# Patient Record
Sex: Female | Born: 1945 | Race: White | Hispanic: No | Marital: Married | Smoking: Never smoker
Health system: Southern US, Community
[De-identification: ages and names within clinical notes are randomized; demographics above are authoritative.]

## PROBLEM LIST (undated history)

## (undated) DIAGNOSIS — I10 Essential (primary) hypertension: Secondary | ICD-10-CM

## (undated) DIAGNOSIS — M1990 Unspecified osteoarthritis, unspecified site: Secondary | ICD-10-CM

## (undated) DIAGNOSIS — E119 Type 2 diabetes mellitus without complications: Secondary | ICD-10-CM

## (undated) DIAGNOSIS — G629 Polyneuropathy, unspecified: Secondary | ICD-10-CM

---

## 2014-03-07 ENCOUNTER — Emergency Department (HOSPITAL_COMMUNITY)
Admission: EM | Admit: 2014-03-07 | Discharge: 2014-03-07 | Disposition: A | Discharge status: Home / Self Care | Payer: Worker's Compensation | Attending: Emergency Medicine | Admitting: Emergency Medicine

## 2014-03-07 ENCOUNTER — Emergency Department (HOSPITAL_COMMUNITY): Payer: Worker's Compensation

## 2014-03-07 ENCOUNTER — Encounter (HOSPITAL_COMMUNITY): Payer: Self-pay | Admitting: Emergency Medicine

## 2014-03-07 DIAGNOSIS — S300XXA Contusion of lower back and pelvis, initial encounter: Secondary | ICD-10-CM

## 2014-03-07 DIAGNOSIS — Y929 Unspecified place or not applicable: Secondary | ICD-10-CM | POA: Insufficient documentation

## 2014-03-07 DIAGNOSIS — M129 Arthropathy, unspecified: Secondary | ICD-10-CM | POA: Insufficient documentation

## 2014-03-07 DIAGNOSIS — E119 Type 2 diabetes mellitus without complications: Secondary | ICD-10-CM | POA: Diagnosis not present

## 2014-03-07 DIAGNOSIS — Z79899 Other long term (current) drug therapy: Secondary | ICD-10-CM | POA: Diagnosis not present

## 2014-03-07 DIAGNOSIS — Z791 Long term (current) use of non-steroidal anti-inflammatories (NSAID): Secondary | ICD-10-CM | POA: Insufficient documentation

## 2014-03-07 DIAGNOSIS — W010XXA Fall on same level from slipping, tripping and stumbling without subsequent striking against object, initial encounter: Secondary | ICD-10-CM | POA: Diagnosis not present

## 2014-03-07 DIAGNOSIS — Y9389 Activity, other specified: Secondary | ICD-10-CM | POA: Insufficient documentation

## 2014-03-07 DIAGNOSIS — IMO0002 Reserved for concepts with insufficient information to code with codable children: Secondary | ICD-10-CM | POA: Diagnosis present

## 2014-03-07 DIAGNOSIS — Z8669 Personal history of other diseases of the nervous system and sense organs: Secondary | ICD-10-CM | POA: Insufficient documentation

## 2014-03-07 DIAGNOSIS — I10 Essential (primary) hypertension: Secondary | ICD-10-CM | POA: Insufficient documentation

## 2014-03-07 DIAGNOSIS — G8929 Other chronic pain: Secondary | ICD-10-CM | POA: Diagnosis not present

## 2014-03-07 DIAGNOSIS — M549 Dorsalgia, unspecified: Secondary | ICD-10-CM

## 2014-03-07 HISTORY — DX: Unspecified osteoarthritis, unspecified site: M19.90

## 2014-03-07 HISTORY — DX: Polyneuropathy, unspecified: G62.9

## 2014-03-07 HISTORY — DX: Essential (primary) hypertension: I10

## 2014-03-07 HISTORY — DX: Type 2 diabetes mellitus without complications: E11.9

## 2014-03-07 MED ORDER — OXYCODONE-ACETAMINOPHEN 5-325 MG PO TABS
1.0000 | ORAL_TABLET | Freq: Four times a day (QID) | ORAL | Status: DC | PRN
Start: 2014-03-07 — End: 2015-12-09

## 2014-03-07 NOTE — ED Provider Notes (Signed)
CSN: [id number]     Arrival date & time 03/07/14  1422 History   First MD Initiated Contact with Patient 03/07/14 1503     Chief Complaint  Patient presents with  . Fall     (Consider location/radiation/quality/duration/timing/severity/associated sxs/prior Treatment) Patient is a 68 y.o. female presenting with fall. The history is provided by the patient.  Fall This is a new problem. Episode onset: 2 days ago. The problem occurs constantly. The problem has been unchanged. Associated symptoms include arthralgias. Pertinent negatives include no abdominal pain, fever, headaches, joint swelling, nausea, neck pain, numbness, rash, urinary symptoms, vomiting or weakness. Exacerbated by: sitting. She has tried oral narcotics, position changes and NSAIDs for the symptoms. The treatment provided no relief.    Patient c/o pain to her lower back and tailbone after a fall that occurred 2 days ago.  She states that she tripped and landed on her buttocks.  She has taken vicodin and a muscle relaxer that she has been taking for chronic pain that has not helped since the fall.  Pain is worse with sitting and improves slightly with standing.  She denies abd pain, incontinence of bladder or bowel, dysuria, numbness or weakness of the LE's.  She also denies head injury or LOC.    Past Medical History  Diagnosis Date  . Diabetes mellitus without complication   . Arthritis   . Hypertension   . Neuropathy    History reviewed. No pertinent past surgical history. History reviewed. No pertinent family history. History  Substance Use Topics  . Smoking status: Never Smoker   . Smokeless tobacco: Not on file  . Alcohol Use: No   OB History   Grav Para Term Preterm Abortions TAB SAB Ect Mult Living                 Review of Systems  Constitutional: Negative for fever.  Respiratory: Negative for shortness of breath.   Gastrointestinal: Negative for nausea, vomiting, abdominal pain and constipation.   Genitourinary: Negative for dysuria, hematuria, flank pain, decreased urine volume and difficulty urinating.       No perineal numbness or incontinence of urine or feces  Musculoskeletal: Positive for arthralgias and back pain. Negative for joint swelling and neck pain.  Skin: Negative for rash.  Neurological: Negative for dizziness, syncope, weakness, numbness and headaches.  All other systems reviewed and are negative.     Allergies  Review of patient's allergies indicates no known allergies.  Home Medications   Prior to Admission medications   Medication Sig Start Date End Date Taking? Authorizing Provider  amLODipine (NORVASC) 5 MG tablet Take 5 mg by mouth daily. 03/07/14  Yes Historical Provider, MD  atenolol (TENORMIN) 100 MG tablet Take 100 mg by mouth daily. 02/20/14  Yes Historical Provider, MD  baclofen (LIORESAL) 20 MG tablet Take 20 mg by mouth daily. 01/22/14  Yes Historical Provider, MD  diclofenac (VOLTAREN) 75 MG EC tablet Take 1 tablet by mouth 2 (two) times daily. 02/20/14  Yes Historical Provider, MD  glimepiride (AMARYL) 4 MG tablet Take 4 mg by mouth 2 (two) times daily. 02/20/14  Yes Historical Provider, MD  HYDROcodone-acetaminophen (NORCO/VICODIN) 5-325 MG per tablet Take 1 tablet by mouth at bedtime. For pain 03/07/14  Yes Historical Provider, MD  losartan (COZAAR) 100 MG tablet Take 100 mg by mouth daily. 02/20/14  Yes Historical Provider, MD  metFORMIN (GLUCOPHAGE) 500 MG tablet Take 1,000 mg by mouth 2 (two) times daily. 02/20/14  Yes Historical  Provider, MD  oxyCODONE-acetaminophen (PERCOCET/ROXICET) 5-325 MG per tablet Take 1 tablet by mouth every 6 (six) hours as needed for severe pain. 03/07/14   Braxen Dobek L. Navraj Dreibelbis, PA-C   BP 165/61  Pulse 62  Temp(Src) 98.3 F (36.8 C) (Oral)  Resp 16  Ht 5\' 4"  (1.626 m)  Wt 176 lb (79.833 kg)  BMI 30.20 kg/m2  SpO2 100% Physical Exam  Nursing note and vitals reviewed. Constitutional: She is oriented to person, place, and  time. She appears well-developed and well-nourished. No distress.  HENT:  Head: Normocephalic and atraumatic.  Neck: Normal range of motion. Neck supple.  Cardiovascular: Normal rate, regular rhythm, normal heart sounds and intact distal pulses.   No murmur heard. Pulmonary/Chest: Effort normal and breath sounds normal. No respiratory distress.  Abdominal: Soft. She exhibits no distension. There is no tenderness. There is no rebound and no guarding.  Musculoskeletal: She exhibits tenderness. She exhibits no edema.       Lumbar back: She exhibits tenderness and pain. She exhibits normal range of motion, no swelling, no deformity, no laceration and normal pulse.  ttp of the coccyx and lower lumbar spine. DP pulses are brisk and symmetrical.  Distal sensation intact.  Hip Flexors/Extensors are intact.  Pt has normal strength against resistance of bilateral lower extremities.     Neurological: She is alert and oriented to person, place, and time. She has normal strength. No sensory deficit. She exhibits normal muscle tone. Coordination and gait normal.  Reflex Scores:      Patellar reflexes are 2+ on the right side and 2+ on the left side.      Achilles reflexes are 2+ on the right side and 2+ on the left side. Skin: Skin is warm and dry. No rash noted.    ED Course  Procedures (including critical care time) Labs Review Labs Reviewed - No data to display  Imaging Review Dg Lumbar Spine Complete  03/07/2014   CLINICAL DATA:  68 year old female with low back pain following fall.  EXAM: LUMBAR SPINE - COMPLETE 4+ VIEW  COMPARISON:  None.  FINDINGS: Five non rib-bearing lumbar type vertebra are identified with mild apex right scoliosis.  There is no evidence of acute fracture.  4 mm retrolisthesis of L3 on L4 noted.  Moderate degenerative disc disease and spondylosis from L2-L5 identified. Facet arthropathy from L4-S1 noted.  No focal bony lesions or spondylolysis noted.  IMPRESSION: No evidence of  acute fracture.  Moderate degenerative changes within the lumbar spine as described with grade 1 spondylolisthesis at L3-L4.   Electronically Signed   By: Laveda AbbeJeff  Hu M.D.   On: 03/07/2014 16:31     EKG Interpretation None      MDM   Final diagnoses:  Back pain  Coccygeal contusion    Pt is ambulatory, no focal neuro deficits on exam.  She is well appearing.  No concerning sx's for emergent neurological or infectious process.  Low back pain is acute on chronic and likely exacerbated by her recent fall.  She agrees to symptomatic treatment with percocet and to continue the diclofenac and baclofen and d/c the hydrocodone.  She appears stable for d/c.    Tawsha Terrero L. Trisha Mangleriplett, PA-C 03/07/14 1720

## 2014-03-07 NOTE — ED Notes (Signed)
Larey Seat 6/2 , tripped over a pts walker.  Pain coccyx and buttocks.

## 2014-03-07 NOTE — Discharge Instructions (Signed)
Back Pain, Adult Back pain is very common. The pain often gets better over time. The cause of back pain is usually not dangerous. Most people can learn to manage their back pain on their own.  HOME CARE   Stay active. Start with short walks on flat ground if you can. Try to walk farther each day.  Do not sit, drive, or stand in one place for more than 30 minutes. Do not stay in bed.  Do not avoid exercise or work. Activity can help your back heal faster.  Be careful when you bend or lift an object. Bend at your knees, keep the object close to you, and do not twist.  Sleep on a firm mattress. Lie on your side, and bend your knees. If you lie on your back, put a pillow under your knees.  Only take medicines as told by your doctor.  Put ice on the injured area.  Put ice in a plastic bag.  Place a towel between your skin and the bag.  Leave the ice on for 15-20 minutes, 03-04 times a day for the first 2 to 3 days. After that, you can switch between ice and heat packs.  Ask your doctor about back exercises or massage.  Avoid feeling anxious or stressed. Find good ways to deal with stress, such as exercise. GET HELP RIGHT AWAY IF:   Your pain does not go away with rest or medicine.  Your pain does not go away in 1 week.  You have new problems.  You do not feel well.  The pain spreads into your legs.  You cannot control when you poop (bowel movement) or pee (urinate).  Your arms or legs feel weak or lose feeling (numbness).  You feel sick to your stomach (nauseous) or throw up (vomit).  You have belly (abdominal) pain.  You feel like you may pass out (faint). MAKE SURE YOU:   Understand these instructions.  Will watch your condition.  Will get help right away if you are not doing well or get worse. Document Released: 03/08/2008 Document Revised: 12/13/2011 Document Reviewed: 02/08/2011 [facility] Patient Information 2014 [place], [place].  Tailbone Injury The tailbone  (coccyx) is the small bone at the lower end of the spine. A tailbone injury may involve stretched ligaments, bruising, or a broken bone (fracture). Women are more vulnerable to this injury due to having a wider pelvis. CAUSES  This type of injury typically occurs from falling and landing on the tailbone. Repeated strain or friction from actions such as rowing and bicycling may also injure the area. The tailbone can be injured during childbirth. Infections or tumors may also press on the tailbone and cause pain. Sometimes, the cause of injury is unknown. SYMPTOMS   Bruising.  Pain when sitting.  Painful bowel movements.  In women, pain during intercourse. DIAGNOSIS  Your caregiver can diagnose a tailbone injury based on your symptoms and a physical exam. X-rays may be taken if a fracture is suspected. Your caregiver may also use an MRI scan imaging test to evaluate your symptoms. TREATMENT  Your caregiver may prescribe medicines to help relieve your pain. Most tailbone injuries heal on their own in 4 to 6 weeks. However, if the injury is caused by an infection or tumor, the recovery period may vary. PREVENTION  Wear appropriate padding and sports gear when bicycling and rowing. This can help prevent an injury from repeated strain or friction. HOME CARE INSTRUCTIONS   Put ice on the injured  area.  Put ice in a plastic bag.  Place a towel between your skin and the bag.  Leave the ice on for 15-20 minutes, every hour while awake for the first 1 to 2 days.  Sit on a large, rubber or inflated ring or cushion to ease your pain. Lean forward when sitting to help decrease discomfort.  Avoid sitting for long periods of time.  Increase your activity as the pain allows.  Only take over-the-counter or prescription medicines for pain, discomfort, or fever as directed by your caregiver.  You may use stool softeners if it is painful to have a bowel movement, or as directed by your  caregiver.  Eat a diet with plenty of fiber to help prevent constipation.  Keep all follow-up appointments as directed by your caregiver. SEEK MEDICAL CARE IF:   Your pain becomes worse.  Your bowel movements cause a great deal of discomfort.  You are unable to have a bowel movement.  You have a fever. MAKE SURE YOU:  Understand these instructions.  Will watch your condition.  Will get help right away if you are not doing well or get worse. Document Released: 09/17/2000 Document Revised: 12/13/2011 Document Reviewed: 04/15/2011 [facility] Patient Information 2014 [place], [place].

## 2014-03-08 NOTE — ED Provider Notes (Signed)
Medical screening examination/treatment/procedure(s) were performed by non-physician practitioner and as supervising physician I was immediately available for consultation/collaboration.   EKG Interpretation None        Amina Menchaca L Anaiyah Anglemyer, MD 03/08/14 1532 

## 2015-12-02 DIAGNOSIS — Z0000 Encounter for general adult medical examination without abnormal findings: Secondary | ICD-10-CM | POA: Diagnosis not present

## 2015-12-02 DIAGNOSIS — I10 Essential (primary) hypertension: Secondary | ICD-10-CM | POA: Diagnosis not present

## 2015-12-02 DIAGNOSIS — G4701 Insomnia due to medical condition: Secondary | ICD-10-CM | POA: Diagnosis not present

## 2015-12-02 DIAGNOSIS — E7800 Pure hypercholesterolemia, unspecified: Secondary | ICD-10-CM | POA: Diagnosis not present

## 2015-12-02 DIAGNOSIS — E1149 Type 2 diabetes mellitus with other diabetic neurological complication: Secondary | ICD-10-CM | POA: Diagnosis not present

## 2015-12-02 DIAGNOSIS — D649 Anemia, unspecified: Secondary | ICD-10-CM | POA: Diagnosis not present

## 2015-12-02 DIAGNOSIS — Z23 Encounter for immunization: Secondary | ICD-10-CM | POA: Diagnosis not present

## 2015-12-02 DIAGNOSIS — Z6828 Body mass index (BMI) 28.0-28.9, adult: Secondary | ICD-10-CM | POA: Diagnosis not present

## 2015-12-02 DIAGNOSIS — E118 Type 2 diabetes mellitus with unspecified complications: Secondary | ICD-10-CM | POA: Diagnosis not present

## 2015-12-09 ENCOUNTER — Ambulatory Visit (INDEPENDENT_AMBULATORY_CARE_PROVIDER_SITE_OTHER): Payer: Worker's Compensation | Admitting: Orthopaedic Surgery

## 2015-12-09 ENCOUNTER — Encounter: Payer: Self-pay | Admitting: Orthopaedic Surgery

## 2015-12-09 ENCOUNTER — Ambulatory Visit (INDEPENDENT_AMBULATORY_CARE_PROVIDER_SITE_OTHER): Payer: Worker's Compensation

## 2015-12-09 VITALS — BP 161/81 | HR 68 | Temp 98.1°F | Resp 16 | Ht 64.0 in | Wt 170.0 lb

## 2015-12-09 DIAGNOSIS — M545 Low back pain, unspecified: Secondary | ICD-10-CM

## 2015-12-09 MED ORDER — HYDROCODONE-ACETAMINOPHEN 7.5-325 MG PO TABS: 1.0000 | ORAL_TABLET | ORAL | Status: DC | PRN

## 2015-12-09 NOTE — Progress Notes (Addendum)
Patient ZO:XWRU:Valerie Huff, female DOB:07/05/1946, 70 y.o. [id number]  Chief Complaint  Patient presents with  . Follow-up    follow up tailbone pain "same"    HPI  Valerie MeekerLula Boulay is a 70 y.o. female who has a history of coccyx pain.  She has developed lower back pain over the last several weeks. She has no sciatica.  She has no trauma or bowel or bladder problems.  Back Pain This is a new problem. The current episode started 1 to 4 weeks ago. The problem occurs daily. The problem has been gradually worsening since onset. The pain is present in the lumbar spine. The quality of the pain is described as aching. The pain does not radiate. The pain is at a severity of 4/10. The pain is mild. The symptoms are aggravated by bending, stress and twisting. Associated symptoms include numbness. Pertinent negatives include no chest pain. She has tried heat, home exercises, ice and NSAIDs for the symptoms. The treatment provided mild relief.    Body mass index is 29.17 kg/(m^2).   Review of Systems  Constitutional:       Patient has Diabetes Mellitus. Patient has hypertension. Patient does not have COPD or shortness of breath. Patient does not have BMI > 35. Patient does not have current smoking history.  HENT: Negative for congestion.   Respiratory: Negative for cough and shortness of breath.   Cardiovascular: Negative for chest pain.  Endocrine: Positive for cold intolerance.  Musculoskeletal: Positive for back pain, joint swelling and arthralgias.  Allergic/Immunologic: Positive for environmental allergies.  Neurological: Positive for numbness.    Past Medical History  Diagnosis Date  . Diabetes mellitus without complication (HCC)   . Arthritis   . Hypertension   . Neuropathy (HCC)     No past surgical history on file.  No family history on file.  Social History Social History  Substance Use Topics  . Smoking status: Never Smoker   . Smokeless tobacco: None  . Alcohol Use:  No    No Known Allergies  Current Outpatient Prescriptions  Medication Sig Dispense Refill  . atenolol (TENORMIN) 100 MG tablet Take 100 mg by mouth daily.    . diclofenac (VOLTAREN) 75 MG EC tablet Take 1 tablet by mouth 2 (two) times daily.    Marland Kitchen. glimepiride (AMARYL) 4 MG tablet Take 4 mg by mouth 2 (two) times daily.    Marland Kitchen. losartan (COZAAR) 100 MG tablet Take 100 mg by mouth daily.    . metFORMIN (GLUCOPHAGE) 500 MG tablet Take 1,000 mg by mouth 2 (two) times daily.    Marland Kitchen. tiZANidine (ZANAFLEX) 4 MG capsule Take 4 mg by mouth.    Marland Kitchen. HYDROcodone-acetaminophen (NORCO) 7.5-325 MG tablet Take 1 tablet by mouth every 4 (four) hours as needed for moderate pain (Must last 30 days.  Do not drive or operate machinery while taking this medicine.). 120 tablet 0   No current facility-administered medications for this visit.     Physical Exam  Blood pressure 161/81, pulse 68, temperature 98.1 F (36.7 C), resp. rate 16, height 5\' 4"  (1.626 m), weight 170 lb (77.111 kg).  Constitutional: overall normal hygiene, normal nutrition, well developed, normal grooming, normal body habitus. Assistive device:none  Musculoskeletal: gait and station Limp none, muscle tone and strength are normal, no tremors or atrophy is present.  .  Neurological: coordination overall normal.  Deep tendon reflex/nerve stretch intact.  Sensation normal.  Cranial nerves II-XII intact.   Skin:   normal overall no scars,  lesions, ulcers or rashes. No psoriasis.  Psychiatric: Alert and oriented x 3.  Recent memory intact, remote memory unclear.  Normal mood and affect. Well groomed.  Good eye contact.  Cardiovascular: overall no swelling, no varicosities, no edema bilaterally, normal temperatures of the legs and arms, no clubbing, cyanosis and good capillary refill.  Lymphatic: palpation is normal.  Spine/Pelvis examination:  Inspection:  Overall, sacoiliac joint benign and hips nontender; without crepitus or  defects.   Thoracic spine inspection: Alignment normal without kyphosis present   Lumbar spine inspection:  Alignment  with normal lumbar lordosis, with scoliosis apparent.   Thoracic spine palpation:  without tenderness of spinal processes   Lumbar spine palpation: with tenderness of lumbar area; without tightness of lumbar muscles    Range of Motion:   Lumbar flexion, forward flexion is 35  without pain or tenderness    Lumbar extension is 5  without pain or tenderness   Left lateral bend is Normal  without pain or tenderness   Right lateral bend is Normal without pain or tenderness   Straight leg raising is Normal   Strength & tone: Normal   Stability overall normal stability   X-rays of the lumbar spine were done.  See separate report.  I have talked to her about her diabetes. She is not checking her blood sugars regularly.  She will talk to her family doctor.  She is taking her blood pressure medicine.  She has no distal edema.  The patient has been educated about the nature of the problem(s) and counseled on treatment options.  The patient appeared to understand what I have discussed and is in agreement with it.  Encounter Diagnosis  Name Primary?  . Midline low back pain without sciatica Yes    PLAN Call if any problems.  Precautions discussed.  Continue current medications.   Return to clinic 6 weeks

## 2016-01-14 DIAGNOSIS — L299 Pruritus, unspecified: Secondary | ICD-10-CM | POA: Diagnosis not present

## 2016-01-14 DIAGNOSIS — E1149 Type 2 diabetes mellitus with other diabetic neurological complication: Secondary | ICD-10-CM | POA: Diagnosis not present

## 2016-01-20 ENCOUNTER — Ambulatory Visit: Payer: Self-pay | Admitting: Orthopaedic Surgery

## 2016-02-02 ENCOUNTER — Telehealth: Payer: Self-pay

## 2016-03-04 ENCOUNTER — Ambulatory Visit (INDEPENDENT_AMBULATORY_CARE_PROVIDER_SITE_OTHER): Payer: Worker's Compensation | Admitting: Orthopaedic Surgery

## 2016-03-04 ENCOUNTER — Ambulatory Visit (INDEPENDENT_AMBULATORY_CARE_PROVIDER_SITE_OTHER): Payer: PPO

## 2016-03-04 ENCOUNTER — Encounter: Payer: Self-pay | Admitting: Orthopaedic Surgery

## 2016-03-04 VITALS — BP 182/68 | HR 56 | Temp 97.7°F | Ht 64.0 in | Wt 170.0 lb

## 2016-03-04 DIAGNOSIS — M5441 Lumbago with sciatica, right side: Secondary | ICD-10-CM

## 2016-03-04 DIAGNOSIS — M533 Sacrococcygeal disorders, not elsewhere classified: Secondary | ICD-10-CM | POA: Diagnosis not present

## 2016-03-04 MED ORDER — HYDROCODONE-ACETAMINOPHEN 7.5-325 MG PO TABS: 1.0000 | ORAL_TABLET | ORAL | Status: DC | PRN

## 2016-03-04 NOTE — Patient Instructions (Signed)
Return after MRi Lumbar spine.

## 2016-03-04 NOTE — Progress Notes (Signed)
[name], female DOB:06-22-1946, 70 y.o. [id number]  Chief Complaint  Patient presents with  . Follow-up    Coccyx pain    HPI  Valerie Huff is a 70 y.o. female who has chronic coccyx pain and also has been developing more lower back pain.  She has begun to have right sided sciatica.  She has no new trauma.  She has been doing her exercises and taking her medicine.  She has no bowel or bladder problems.  Ice, rest, heat have not helped.  HPI  Body mass index is 29.17 kg/(m^2).  ROS  Review of Systems  Constitutional:       Patient has Diabetes Mellitus. Patient has hypertension. Patient does not have COPD or shortness of breath. Patient does not have BMI > 35. Patient does not have current smoking history.  HENT: Negative for congestion.   Respiratory: Negative for cough and shortness of breath.   Cardiovascular: Negative for chest pain.  Endocrine: Positive for cold intolerance.  Musculoskeletal: Positive for back pain, joint swelling and arthralgias.  Allergic/Immunologic: Positive for environmental allergies.  Neurological: Positive for numbness.    Past Medical History  Diagnosis Date  . Diabetes mellitus without complication (HCC)   . Arthritis   . Hypertension   . Neuropathy (HCC)     History reviewed. No pertinent past surgical history.  History reviewed. No pertinent family history.  Social History Social History  Substance Use Topics  . Smoking status: Never Smoker   . Smokeless tobacco: None  . Alcohol Use: No    No Known Allergies  Current Outpatient Prescriptions  Medication Sig Dispense Refill  . atenolol (TENORMIN) 100 MG tablet Take 100 mg by mouth daily.    . diclofenac (VOLTAREN) 75 MG EC tablet Take 1 tablet by mouth 2 (two) times daily.    Marland Kitchen glimepiride (AMARYL) 4 MG tablet Take 4 mg by mouth 2 (two) times daily.    Marland Kitchen HYDROcodone-acetaminophen (NORCO) 7.5-325 MG tablet Take 1 tablet by mouth every 4 (four) hours as  needed for moderate pain (Must last 30 days.  Do not drive or operate machinery while taking this medicine.). 120 tablet 0  . losartan (COZAAR) 100 MG tablet Take 100 mg by mouth daily.    . metFORMIN (GLUCOPHAGE) 500 MG tablet Take 1,000 mg by mouth 2 (two) times daily.    Marland Kitchen tiZANidine (ZANAFLEX) 4 MG capsule Take 4 mg by mouth.     No current facility-administered medications for this visit.     Physical Exam  Blood pressure 182/68, pulse 56, temperature 97.7 F (36.5 C), height  (1.626 m), weight 170 lb (77.111 kg).  Constitutional: overall normal hygiene, normal nutrition, well developed, normal grooming, normal body habitus. Assistive device:none  Musculoskeletal: gait and station Limp none, muscle tone and strength are normal, no tremors or atrophy is present.  .  Neurological: coordination overall normal.  Deep tendon reflex/nerve stretch intact.  Sensation normal.  Cranial nerves II-XII intact.   Skin:   normal overall no scars, lesions, ulcers or rashes. No psoriasis.  Psychiatric: Alert and oriented x 3.  Recent memory intact, remote memory unclear.  Normal mood and affect. Well groomed.  Good eye contact.  Cardiovascular: overall no swelling, no varicosities, no edema bilaterally, normal temperatures of the legs and arms, no clubbing, cyanosis and good capillary refill.  Lymphatic: palpation is normal.  Spine/Pelvis examination:  Inspection:  Overall, sacoiliac joint benign and hips nontender; without crepitus or defects.   Thoracic  spine inspection: Alignment normal without kyphosis present   Lumbar spine inspection:  Alignment  with normal lumbar lordosis, without scoliosis apparent.   Thoracic spine palpation:  without tenderness of spinal processes   Lumbar spine palpation: with tenderness of lumbar area; without tightness of lumbar muscles    Range of Motion:   Lumbar flexion, forward flexion is 40  without pain or tenderness    Lumbar extension is 10   without pain or tenderness   Left lateral bend is Normal  without pain or tenderness   Right lateral bend is Normal without pain or tenderness   Straight leg raising is Normal   Strength & tone: Normal   Stability overall normal stability    X-rays were done of the lumbar spine, see separate report.  The patient has been educated about the nature of the problem(s) and counseled on treatment options.  The patient appeared to understand what I have discussed and is in agreement with it.  Encounter Diagnoses  Name Primary?  . Right-sided low back pain with right-sided sciatica Yes  . Coccyalgia     PLAN Call if any problems.  Precautions discussed.  Continue current medications.   Return to clinic after MRI of the lumbar spine.   Electronically Signed Darreld McleanWayne Nayef College, MD 6/1/20173:43 PM

## 2016-04-07 DIAGNOSIS — H60592 Other noninfective acute otitis externa, left ear: Secondary | ICD-10-CM | POA: Diagnosis not present

## 2016-04-07 DIAGNOSIS — G4701 Insomnia due to medical condition: Secondary | ICD-10-CM | POA: Diagnosis not present

## 2016-04-13 DIAGNOSIS — T162XXD Foreign body in left ear, subsequent encounter: Secondary | ICD-10-CM | POA: Diagnosis not present

## 2016-04-13 DIAGNOSIS — Z6828 Body mass index (BMI) 28.0-28.9, adult: Secondary | ICD-10-CM | POA: Diagnosis not present

## 2016-04-13 DIAGNOSIS — I10 Essential (primary) hypertension: Secondary | ICD-10-CM | POA: Diagnosis not present

## 2016-04-13 DIAGNOSIS — E118 Type 2 diabetes mellitus with unspecified complications: Secondary | ICD-10-CM | POA: Diagnosis not present

## 2016-05-06 ENCOUNTER — Ambulatory Visit (INDEPENDENT_AMBULATORY_CARE_PROVIDER_SITE_OTHER): Payer: PPO | Admitting: Otolaryngology

## 2016-05-06 DIAGNOSIS — H6983 Other specified disorders of Eustachian tube, bilateral: Secondary | ICD-10-CM | POA: Diagnosis not present

## 2016-05-06 DIAGNOSIS — H903 Sensorineural hearing loss, bilateral: Secondary | ICD-10-CM

## 2016-05-06 DIAGNOSIS — H9313 Tinnitus, bilateral: Secondary | ICD-10-CM | POA: Diagnosis not present

## 2016-05-17 ENCOUNTER — Telehealth: Payer: Self-pay | Admitting: Orthopaedic Surgery

## 2016-05-17 NOTE — Telephone Encounter (Signed)
Was an MRI ever scheduled for this patient?  I didn't see anything.  Could you check this for her and let her know when the MRI is scheduled and when she is to return here.  Thanks

## 2016-05-17 NOTE — Telephone Encounter (Signed)
Patient requested a refill on Hydrocodone/Acetaminophen(Norco) 7.5-325 mgs.  Qty 120 ° °Sig: Take 1 tablet by mouth every 4 (four) hours as needed for moderate pain (Must last 30 days.  Do not drive or operate machinery while taking this medicine.). °

## 2016-05-18 MED ORDER — HYDROCODONE-ACETAMINOPHEN 7.5-325 MG PO TABS: 1.0000 | ORAL_TABLET | ORAL | 0 refills | Status: DC | PRN

## 2016-05-19 ENCOUNTER — Encounter: Payer: Self-pay | Admitting: Orthopaedic Surgery

## 2016-05-19 ENCOUNTER — Ambulatory Visit (INDEPENDENT_AMBULATORY_CARE_PROVIDER_SITE_OTHER): Payer: Worker's Compensation | Admitting: Orthopaedic Surgery

## 2016-05-19 VITALS — BP 188/72 | HR 54 | Temp 97.7°F | Ht 64.0 in | Wt 169.6 lb

## 2016-05-19 DIAGNOSIS — M533 Sacrococcygeal disorders, not elsewhere classified: Secondary | ICD-10-CM | POA: Diagnosis not present

## 2016-05-19 DIAGNOSIS — M5441 Lumbago with sciatica, right side: Secondary | ICD-10-CM

## 2016-05-19 MED ORDER — HYDROCODONE-ACETAMINOPHEN 5-325 MG PO TABS: 1.0000 | ORAL_TABLET | ORAL | 0 refills | Status: DC | PRN

## 2016-05-19 NOTE — Progress Notes (Signed)
Patient AV:WUJW:Valerie Huff, female DOB:10/10/1945, 70 y.o. JXB:[id number]  Chief Complaint  Patient presents with  . Follow-up    coccyx pain    HPI  Valerie Huff is a 70 y.o. female who has chronic coccyx pain. She has no new trauma.  She is not working. She has been taking her medicine.  She has no redness or increased pain, it is a steady chronic pain. HPI  Body mass index is 29.11 kg/m.  ROS  Review of Systems  Constitutional:       Patient has Diabetes Mellitus. Patient has hypertension. Patient does not have COPD or shortness of breath. Patient does not have BMI > 35. Patient does not have current smoking history.  HENT: Negative for congestion.   Respiratory: Negative for cough and shortness of breath.   Cardiovascular: Negative for chest pain.  Endocrine: Positive for cold intolerance.  Musculoskeletal: Positive for arthralgias, back pain and joint swelling.  Allergic/Immunologic: Positive for environmental allergies.  Neurological: Positive for numbness.    Past Medical History:  Diagnosis Date  . Arthritis   . Diabetes mellitus without complication (HCC)   . Hypertension   . Neuropathy (HCC)     No past surgical history on file.  No family history on file.  Social History Social History  Substance Use Topics  . Smoking status: Never Smoker  . Smokeless tobacco: Never Used  . Alcohol use No    No Known Allergies  Current Outpatient Prescriptions  Medication Sig Dispense Refill  . atenolol (TENORMIN) 100 MG tablet Take 100 mg by mouth daily.    . diclofenac (VOLTAREN) 75 MG EC tablet Take 1 tablet by mouth 2 (two) times daily.    Marland Kitchen. glimepiride (AMARYL) 4 MG tablet Take 4 mg by mouth 2 (two) times daily.    Marland Kitchen. losartan (COZAAR) 100 MG tablet Take 100 mg by mouth daily.    . metFORMIN (GLUCOPHAGE) 500 MG tablet Take 1,000 mg by mouth 2 (two) times daily.    Marland Kitchen. tiZANidine (ZANAFLEX) 4 MG capsule Take 4 mg by mouth.    Marland Kitchen. HYDROcodone-acetaminophen  (NORCO/VICODIN) 5-325 MG tablet Take 1 tablet by mouth every 4 (four) hours as needed for moderate pain (Must last 30 days.  Do not take and drive a car or use machinery.). 120 tablet 0   No current facility-administered medications for this visit.      Physical Exam  Blood pressure (!) 188/72, pulse (!) 54, temperature 97.7 F (36.5 C), height 5\' 4"  (1.626 m), weight 169 lb 9.6 oz (76.9 kg).  Constitutional: overall normal hygiene, normal nutrition, well developed, normal grooming, normal body habitus. Assistive device:none  Musculoskeletal: gait and station Limp none, muscle tone and strength are normal, no tremors or atrophy is present.  .  Neurological: coordination overall normal.  Deep tendon reflex/nerve stretch intact.  Sensation normal.  Cranial nerves II-XII intact.   Skin:   normal overall no scars, lesions, ulcers or rashes. No psoriasis.  Psychiatric: Alert and oriented x 3.  Recent memory intact, remote memory unclear.  Normal mood and affect. Well groomed.  Good eye contact.  Cardiovascular: overall no swelling, no varicosities, no edema bilaterally, normal temperatures of the legs and arms, no clubbing, cyanosis and good capillary refill.  Lymphatic: palpation is normal.  She has coccyx pain.  NV intact.  The patient has been educated about the nature of the problem(s) and counseled on treatment options.  The patient appeared to understand what I have discussed and is in  agreement with it.  Encounter Diagnoses  Name Primary?  . Coccyalgia Yes  . Right-sided low back pain with right-sided sciatica     PLAN Call if any problems.  Precautions discussed.  Continue current medications.   Return to clinic 3 months   Electronically Signed Darreld McleanWayne Kyllian Clingerman, MD 8/16/20172:55 PM

## 2016-05-19 NOTE — Telephone Encounter (Signed)
No she is waiting on her lawyer to complete some paperwork to get it paid for.  I called and spoke with the patient.  She told me she has an apt scheduled here this week for her " tail bone"

## 2016-08-03 ENCOUNTER — Encounter: Payer: Self-pay | Admitting: Orthopaedic Surgery

## 2016-08-09 NOTE — Telephone Encounter (Signed)
error 

## 2016-08-16 DIAGNOSIS — I739 Peripheral vascular disease, unspecified: Secondary | ICD-10-CM | POA: Diagnosis not present

## 2016-08-16 DIAGNOSIS — E118 Type 2 diabetes mellitus with unspecified complications: Secondary | ICD-10-CM | POA: Diagnosis not present

## 2016-08-16 DIAGNOSIS — J018 Other acute sinusitis: Secondary | ICD-10-CM | POA: Diagnosis not present

## 2016-08-16 DIAGNOSIS — E1149 Type 2 diabetes mellitus with other diabetic neurological complication: Secondary | ICD-10-CM | POA: Diagnosis not present

## 2016-08-19 ENCOUNTER — Ambulatory Visit: Payer: Self-pay | Admitting: Orthopaedic Surgery

## 2016-08-23 DIAGNOSIS — E119 Type 2 diabetes mellitus without complications: Secondary | ICD-10-CM | POA: Diagnosis not present

## 2016-08-23 DIAGNOSIS — L84 Corns and callosities: Secondary | ICD-10-CM | POA: Diagnosis not present

## 2016-08-23 DIAGNOSIS — I739 Peripheral vascular disease, unspecified: Secondary | ICD-10-CM | POA: Diagnosis not present

## 2016-08-31 ENCOUNTER — Ambulatory Visit (INDEPENDENT_AMBULATORY_CARE_PROVIDER_SITE_OTHER): Payer: Worker's Compensation | Admitting: Orthopaedic Surgery

## 2016-08-31 VITALS — BP 155/74 | HR 62 | Ht 64.0 in | Wt 169.0 lb

## 2016-08-31 DIAGNOSIS — M533 Sacrococcygeal disorders, not elsewhere classified: Secondary | ICD-10-CM | POA: Diagnosis not present

## 2016-08-31 MED ORDER — HYDROCODONE-ACETAMINOPHEN 5-325 MG PO TABS: 1.0000 | ORAL_TABLET | ORAL | 0 refills | Status: DC | PRN

## 2016-08-31 NOTE — Progress Notes (Signed)
Patient WU:JWJX:Valerie Huff, female DOB:07/07/1946, 70 y.o. BJY:[id number]  Chief Complaint  Patient presents with  . Follow-up    Back pain    HPI  Valerie MeekerLula Huff is a 70 y.o. female who has chronic coccyx pain. She has no new trauma. She is uncomfortable still with prolonged sitting.  She is taking her medicine. HPI  Body mass index is 29.01 kg/m.  ROS  Review of Systems  Constitutional:       Patient has Diabetes Mellitus. Patient has hypertension. Patient does not have COPD or shortness of breath. Patient does not have BMI > 35. Patient does not have current smoking history.  HENT: Negative for congestion.   Respiratory: Negative for cough and shortness of breath.   Cardiovascular: Negative for chest pain.  Endocrine: Positive for cold intolerance.  Musculoskeletal: Positive for arthralgias, back pain and joint swelling.  Allergic/Immunologic: Positive for environmental allergies.  Neurological: Positive for numbness.    Past Medical History:  Diagnosis Date  . Arthritis   . Diabetes mellitus without complication (HCC)   . Hypertension   . Neuropathy (HCC)     No past surgical history on file.  No family history on file.  Social History Social History  Substance Use Topics  . Smoking status: Never Smoker  . Smokeless tobacco: Never Used  . Alcohol use No    No Known Allergies  Current Outpatient Prescriptions  Medication Sig Dispense Refill  . atenolol (TENORMIN) 100 MG tablet Take 100 mg by mouth daily.    . diclofenac (VOLTAREN) 75 MG EC tablet Take 1 tablet by mouth 2 (two) times daily.    Marland Kitchen. glimepiride (AMARYL) 4 MG tablet Take 4 mg by mouth 2 (two) times daily.    Marland Kitchen. HYDROcodone-acetaminophen (NORCO/VICODIN) 5-325 MG tablet Take 1 tablet by mouth every 4 (four) hours as needed for moderate pain (Must last 30 days.  Do not take and drive a car or use machinery.). 120 tablet 0  . losartan (COZAAR) 100 MG tablet Take 100 mg by mouth daily.    . metFORMIN  (GLUCOPHAGE) 500 MG tablet Take 1,000 mg by mouth 2 (two) times daily.    Marland Kitchen. tiZANidine (ZANAFLEX) 4 MG capsule Take 4 mg by mouth.     No current facility-administered medications for this visit.      Physical Exam  Blood pressure (!) 155/74, pulse 62, height 5\' 4"  (1.626 m), weight 169 lb (76.7 kg).  Constitutional: overall normal hygiene, normal nutrition, well developed, normal grooming, normal body habitus. Assistive device:none  Musculoskeletal: gait and station Limp none, muscle tone and strength are normal, no tremors or atrophy is present.  .  Neurological: coordination overall normal.  Deep tendon reflex/nerve stretch intact.  Sensation normal.  Cranial nerves II-XII intact.   Skin:   Normal overall no scars, lesions, ulcers or rashes. No psoriasis.  Psychiatric: Alert and oriented x 3.  Recent memory intact, remote memory unclear.  Normal mood and affect. Well groomed.  Good eye contact.  Cardiovascular: overall no swelling, no varicosities, no edema bilaterally, normal temperatures of the legs and arms, no clubbing, cyanosis and good capillary refill.  Lymphatic: palpation is normal.  She has diffuse coccyx pain.  The patient has been educated about the nature of the problem(s) and counseled on treatment options.  The patient appeared to understand what I have discussed and is in agreement with it.  Encounter Diagnosis  Name Primary?  . Coccyalgia Yes    PLAN Call if any problems.  Precautions discussed.  Continue current medications.   Return to clinic 3 months   Electronically Signed Darreld McleanWayne Caroljean Monsivais, MD 11/28/20173:10 PM

## 2016-12-01 ENCOUNTER — Ambulatory Visit (INDEPENDENT_AMBULATORY_CARE_PROVIDER_SITE_OTHER): Payer: Worker's Compensation | Admitting: Orthopaedic Surgery

## 2016-12-01 ENCOUNTER — Ambulatory Visit (INDEPENDENT_AMBULATORY_CARE_PROVIDER_SITE_OTHER): Payer: Worker's Compensation

## 2016-12-01 ENCOUNTER — Encounter: Payer: Self-pay | Admitting: Orthopaedic Surgery

## 2016-12-01 VITALS — BP 156/65 | HR 57 | Temp 98.1°F | Ht 64.0 in | Wt 172.0 lb

## 2016-12-01 DIAGNOSIS — M5441 Lumbago with sciatica, right side: Secondary | ICD-10-CM

## 2016-12-01 DIAGNOSIS — M533 Sacrococcygeal disorders, not elsewhere classified: Secondary | ICD-10-CM | POA: Diagnosis not present

## 2016-12-01 DIAGNOSIS — G8929 Other chronic pain: Secondary | ICD-10-CM

## 2016-12-01 MED ORDER — HYDROCODONE-ACETAMINOPHEN 5-325 MG PO TABS: 1.0000 | ORAL_TABLET | ORAL | 0 refills | Status: DC | PRN

## 2016-12-01 MED ORDER — CYCLOBENZAPRINE HCL 10 MG PO TABS: 10.0000 mg | ORAL_TABLET | Freq: Three times a day (TID) | ORAL | 0 refills | Status: AC

## 2016-12-01 NOTE — Progress Notes (Signed)
[name], female DOB:1946-08-06, 71 y.o. EAV:[id number]  Chief Complaint  Patient presents with  . Follow-up    coccyx pain    HPI  Valerie Huff is a 71 y.o. female who has been followed for coccyx pain related to a Worker's Compensation injury some time ago.  That is about the same.  She has pain after sitting periods of time.  She has developed new problems with pain running down the right leg and spasm of both legs at night.  She says this is getting progressively worse.  She has no weakness, no bowel or bladder problem.  She denies any new trauma.  She has no redness.  The spasm are intense and last a while.  She has to use cold or hot rags to get relief.  She has history of neuropathy but this is more from the lower back to the thighs and legs and not the feet. HPI  Body mass index is 29.52 kg/m.  ROS  Review of Systems  Constitutional:       Patient has Diabetes Mellitus. Patient has hypertension. Patient does not have COPD or shortness of breath. Patient does not have BMI > 35. Patient does not have current smoking history.  HENT: Negative for congestion.   Respiratory: Negative for cough and shortness of breath.   Cardiovascular: Negative for chest pain.  Endocrine: Positive for cold intolerance.  Musculoskeletal: Positive for arthralgias, back pain and joint swelling.  Allergic/Immunologic: Positive for environmental allergies.  Neurological: Positive for numbness.    Past Medical History:  Diagnosis Date  . Arthritis   . Diabetes mellitus without complication (HCC)   . Hypertension   . Neuropathy (HCC)     No past surgical history on file.  No family history on file.  Social History Social History  Substance Use Topics  . Smoking status: Never Smoker  . Smokeless tobacco: Never Used  . Alcohol use No    No Known Allergies  Current Outpatient Prescriptions  Medication Sig Dispense Refill  . atenolol (TENORMIN) 100 MG tablet Take 100  mg by mouth daily.    . cyclobenzaprine (FLEXERIL) 10 MG tablet Take 1 tablet (10 mg total) by mouth 3 (three) times daily. Take one tablet every 8 hours as needed for spasm. 40 tablet 0  . diclofenac (VOLTAREN) 75 MG EC tablet Take 1 tablet by mouth 2 (two) times daily.    Marland Kitchen glimepiride (AMARYL) 4 MG tablet Take 4 mg by mouth 2 (two) times daily.    Marland Kitchen HYDROcodone-acetaminophen (NORCO/VICODIN) 5-325 MG tablet Take 1 tablet by mouth every 4 (four) hours as needed for moderate pain (Must last 30 days.  Do not take and drive a car or use machinery.). 120 tablet 0  . losartan (COZAAR) 100 MG tablet Take 100 mg by mouth daily.    . metFORMIN (GLUCOPHAGE) 500 MG tablet Take 1,000 mg by mouth 2 (two) times daily.    Marland Kitchen tiZANidine (ZANAFLEX) 4 MG capsule Take 4 mg by mouth.     No current facility-administered medications for this visit.      Physical Exam  Blood pressure (!) 156/65, pulse (!) 57, temperature 98.1 F (36.7 C), height 5\' 4"  (1.626 m), weight 172 lb (78 kg).  Constitutional: overall normal hygiene, normal nutrition, well developed, normal grooming, normal body habitus. Assistive device:none  Musculoskeletal: gait and station Limp none, muscle tone and strength are normal, no tremors or atrophy is present.  .  Neurological: coordination overall normal.  Deep  tendon reflex/nerve stretch intact.  Sensation normal.  Cranial nerves II-XII intact.   Skin:   Normal overall no scars, lesions, ulcers or rashes. No psoriasis.  Psychiatric: Alert and oriented x 3.  Recent memory intact, remote memory unclear.  Normal mood and affect. Well groomed.  Good eye contact.  Cardiovascular: overall no swelling, no varicosities, no edema bilaterally, normal temperatures of the legs and arms, no clubbing, cyanosis and good capillary refill.  Lymphatic: palpation is normal.  Spine/Pelvis examination:  Inspection:  Overall, sacoiliac joint benign and hips nontender; without crepitus or  defects.   Thoracic spine inspection: Alignment normal without kyphosis present   Lumbar spine inspection:  Alignment  with normal lumbar lordosis, without scoliosis apparent.   Thoracic spine palpation:  without tenderness of spinal processes   Lumbar spine palpation: with tenderness of lumbar area; without tightness of lumbar muscles    Range of Motion:   Lumbar flexion, forward flexion is 35 with pain or tenderness    Lumbar extension is 5 with pain or tenderness   Left lateral bend is Normal  without pain or tenderness   Right lateral bend is Normal without pain or tenderness   Straight leg raising is Normal   Strength & tone: Normal   Stability overall normal stability     The patient has been educated about the nature of the problem(s) and counseled on treatment options.  The patient appeared to understand what I have discussed and is in agreement with it.  Encounter Diagnoses  Name Primary?  . Chronic right-sided low back pain with right-sided sciatica Yes  . Coccyalgia     PLAN Call if any problems.  Precautions discussed.  Continue current medications.   Return to clinic Get MRI of the lumbar spine   X-rays of the lumbar spine were done and reported separately.  I am concerned about an area of sclerosis of L3-L4 which could be an early lytic lesion.  I would like to get a MRI to rule this out.  She has loss of lordosis of the spine.  I have reviewed the [place] [employer] Cont[place] Sub[place] Reporting System web site prior to prescribing narcotic medicine for this patient.  I have given Flexeril for the spasm  Return after the MRI.  Electronically Signed Darreld McleanWayne Raevyn Sokol, MD 2/28/20183:56 PM

## 2016-12-14 ENCOUNTER — Other Ambulatory Visit: Payer: Self-pay | Admitting: Orthopaedic Surgery

## 2016-12-14 ENCOUNTER — Ambulatory Visit
Admission: RE | Admit: 2016-12-14 | Discharge: 2016-12-14 | Disposition: A | Discharge status: Home / Self Care | Payer: PPO | Source: Ambulatory Visit | Attending: Orthopaedic Surgery | Admitting: Orthopaedic Surgery

## 2016-12-14 DIAGNOSIS — M5441 Lumbago with sciatica, right side: Principal | ICD-10-CM

## 2016-12-14 DIAGNOSIS — G8929 Other chronic pain: Secondary | ICD-10-CM

## 2016-12-14 DIAGNOSIS — M48061 Spinal stenosis, lumbar region without neurogenic claudication: Secondary | ICD-10-CM | POA: Diagnosis not present

## 2016-12-14 MED ORDER — GADOBENATE DIMEGLUMINE 529 MG/ML IV SOLN
15.0000 mL | Freq: Once | INTRAVENOUS | Status: AC | PRN
  Administered 2016-12-14: 15 mL via INTRAVENOUS

## 2016-12-16 ENCOUNTER — Encounter: Payer: Self-pay | Admitting: Orthopaedic Surgery

## 2016-12-16 ENCOUNTER — Ambulatory Visit (INDEPENDENT_AMBULATORY_CARE_PROVIDER_SITE_OTHER): Payer: PPO | Admitting: Orthopaedic Surgery

## 2016-12-16 VITALS — BP 149/75 | HR 66 | Temp 97.7°F | Ht 64.0 in | Wt 172.0 lb

## 2016-12-16 DIAGNOSIS — M5441 Lumbago with sciatica, right side: Secondary | ICD-10-CM

## 2016-12-16 DIAGNOSIS — G8929 Other chronic pain: Secondary | ICD-10-CM

## 2016-12-16 NOTE — Progress Notes (Addendum)
[name], female DOB:07-Jun-1946, 71 y.o. UJW:[id number]  Chief Complaint  Patient presents with  . Follow-up    MRI L SPINE REVIEW    HPI  Valerie Huff is a 71 y.o. female who has marked pain of the lower back and right sided sciatica. She had MRI done and it shows: IMPRESSION: 1. Dextroconvex lumbar scoliosis and reversal of lumbar lordosis have both progressed since 2015. 2. Associated advanced chronic disc, endplate, and/or posterior element degeneration from L2-L3 to L5-S1. 3. Subsequent multifactorial: - Severe spinal and lateral recess stenosis at L4-L5, - Moderate spinal stenosis with moderate to severe left lateral recess stenosis at L3-L4, - Mild spinal stenosis with moderate left lateral recess stenosis at L2-L3, and - Mild spinal and right lateral recess stenosis at L5-S1. 4. Associated multifactorial neural foraminal stenosis, up to moderate at the left L3 and bilateral L4 nerve levels and moderate to severe at the Right L5 nerve level.  I have gone over the findings with her. I may have overwhelmed her with the discussion.  I feel she needs to see a neurosurgeon for further evaluation. HPI  Body mass index is 29.52 kg/m.  ROS  Review of Systems  Past Medical History:  Diagnosis Date  . Arthritis   . Diabetes mellitus without complication (HCC)   . Hypertension   . Neuropathy (HCC)     No past surgical history on file.  No family history on file.  Social History Social History  Substance Use Topics  . Smoking status: Never Smoker  . Smokeless tobacco: Never Used  . Alcohol use No    No Known Allergies  Current Outpatient Prescriptions  Medication Sig Dispense Refill  . atenolol (TENORMIN) 100 MG tablet Take 100 mg by mouth daily.    . cyclobenzaprine (FLEXERIL) 10 MG tablet Take 1 tablet (10 mg total) by mouth 3 (three) times daily. Take one tablet every 8 hours as needed for spasm. 40 tablet 0  . diclofenac (VOLTAREN) 75 MG  EC tablet Take 1 tablet by mouth 2 (two) times daily.    Marland Kitchen glimepiride (AMARYL) 4 MG tablet Take 4 mg by mouth 2 (two) times daily.    Marland Kitchen HYDROcodone-acetaminophen (NORCO/VICODIN) 5-325 MG tablet Take 1 tablet by mouth every 4 (four) hours as needed for moderate pain (Must last 30 days.  Do not take and drive a car or use machinery.). 120 tablet 0  . losartan (COZAAR) 100 MG tablet Take 100 mg by mouth daily.    . metFORMIN (GLUCOPHAGE) 500 MG tablet Take 1,000 mg by mouth 2 (two) times daily.    Marland Kitchen tiZANidine (ZANAFLEX) 4 MG capsule Take 4 mg by mouth.     No current facility-administered medications for this visit.      Physical Exam  Blood pressure (!) 149/75, pulse 66, temperature 97.7 F (36.5 C), height 5\' 4"  (1.626 m), weight 172 lb (78 kg).  Constitutional: overall normal hygiene, normal nutrition, well developed, normal grooming, normal body habitus. Assistive device:none  Musculoskeletal: gait and station Limp right, muscle tone and strength are normal, no tremors or atrophy is present.  .  Neurological: coordination overall normal.  Deep tendon reflex/nerve stretch intact.  Sensation normal.  Cranial nerves II-XII intact.   Skin:   Normal overall no scars, lesions, ulcers or rashes. No psoriasis.  Psychiatric: Alert and oriented x 3.  Recent memory intact, remote memory unclear.  Normal mood and affect. Well groomed.  Good eye contact.  Cardiovascular: overall no swelling, no  varicosities, no edema bilaterally, normal temperatures of the legs and arms, no clubbing, cyanosis and good capillary refill.  Lymphatic: palpation is normal.  She has right sided scoliosis of the lumbar spine.  She has pain with motion and flexion only to 15 degrees.  Left and right lateral bend are painful.  SLR is positive on the right at 20 degrees. She has no obvious weakness, DTRs negative.  The patient has been educated about the nature of the problem(s) and counseled on treatment options.  The  patient appeared to understand what I have discussed and is in agreement with it.  Encounter Diagnosis  Name Primary?  . Chronic right-sided low back pain with right-sided sciatica Yes    PLAN Call if any problems.  Precautions discussed.  Continue current medications.   Return to clinic 1 month   To see neurosurgeon.  Electronically Signed Darreld McleanWayne Rea Kalama, MD 3/15/20184:03 PM  Addendum:  She also at this visit asked about a venous compression device.  She has had problems with swelling of the feet. Dr. Margo Commonapper has seen her for this and he has suggested the venous compression device.  I am not actively treating her for this, I am treating her coccyx problem.  He has contacted me and asked if I would order this.  I will but I am not treating her for this.  I will have papers to sign.  She will need to be followed by Dr. Margo Commonapper or a vascular surgeon for treatment.  She is thankful for me doing this and taking care of the necessary forms.  The above is for the date of service in March of 2018.  Today I received additional forms from the insurance company to complete almost a year later.  I have added to above for proper documentation that I did not do at the time.  Electronically Signed Darreld McleanWayne Cortavius Montesinos, MD 2/27/20195:08 PM

## 2016-12-24 DIAGNOSIS — Z6829 Body mass index (BMI) 29.0-29.9, adult: Secondary | ICD-10-CM | POA: Diagnosis not present

## 2016-12-24 DIAGNOSIS — I10 Essential (primary) hypertension: Secondary | ICD-10-CM | POA: Diagnosis not present

## 2016-12-24 DIAGNOSIS — M48062 Spinal stenosis, lumbar region with neurogenic claudication: Secondary | ICD-10-CM | POA: Diagnosis not present

## 2017-01-19 ENCOUNTER — Telehealth: Payer: Self-pay | Admitting: Orthopaedic Surgery

## 2017-01-19 NOTE — Telephone Encounter (Signed)
Patient called for refill:  HYDROcodone-acetaminophen (NORCO/VICODIN) 5-325 MG tablet 120 tablet

## 2017-01-20 DIAGNOSIS — M48062 Spinal stenosis, lumbar region with neurogenic claudication: Secondary | ICD-10-CM | POA: Diagnosis not present

## 2017-01-20 MED ORDER — HYDROCODONE-ACETAMINOPHEN 5-325 MG PO TABS: 1.0000 | ORAL_TABLET | Freq: Four times a day (QID) | ORAL | 0 refills | Status: DC | PRN

## 2017-03-10 DIAGNOSIS — R252 Cramp and spasm: Secondary | ICD-10-CM | POA: Diagnosis not present

## 2017-03-10 DIAGNOSIS — L03031 Cellulitis of right toe: Secondary | ICD-10-CM | POA: Diagnosis not present

## 2017-03-10 DIAGNOSIS — L03039 Cellulitis of unspecified toe: Secondary | ICD-10-CM | POA: Diagnosis not present

## 2017-03-16 DIAGNOSIS — E118 Type 2 diabetes mellitus with unspecified complications: Secondary | ICD-10-CM | POA: Diagnosis not present

## 2017-03-16 DIAGNOSIS — I10 Essential (primary) hypertension: Secondary | ICD-10-CM | POA: Diagnosis not present

## 2017-03-16 DIAGNOSIS — G4762 Sleep related leg cramps: Secondary | ICD-10-CM | POA: Diagnosis not present

## 2017-03-16 DIAGNOSIS — S91109A Unspecified open wound of unspecified toe(s) without damage to nail, initial encounter: Secondary | ICD-10-CM | POA: Diagnosis not present

## 2017-04-14 ENCOUNTER — Ambulatory Visit (INDEPENDENT_AMBULATORY_CARE_PROVIDER_SITE_OTHER): Payer: Worker's Compensation | Admitting: Orthopaedic Surgery

## 2017-04-14 ENCOUNTER — Encounter: Payer: Self-pay | Admitting: Orthopaedic Surgery

## 2017-04-14 VITALS — BP 181/73 | HR 61 | Temp 96.9°F | Ht 64.0 in | Wt 171.0 lb

## 2017-04-14 DIAGNOSIS — F329 Major depressive disorder, single episode, unspecified: Secondary | ICD-10-CM | POA: Diagnosis not present

## 2017-04-14 DIAGNOSIS — M5441 Lumbago with sciatica, right side: Secondary | ICD-10-CM | POA: Diagnosis not present

## 2017-04-14 DIAGNOSIS — G8929 Other chronic pain: Secondary | ICD-10-CM

## 2017-04-14 DIAGNOSIS — M533 Sacrococcygeal disorders, not elsewhere classified: Secondary | ICD-10-CM | POA: Diagnosis not present

## 2017-04-14 DIAGNOSIS — F32A Depression, unspecified: Secondary | ICD-10-CM

## 2017-04-14 MED ORDER — HYDROCODONE-ACETAMINOPHEN 5-325 MG PO TABS: 1.0000 | ORAL_TABLET | Freq: Four times a day (QID) | ORAL | 0 refills | Status: DC | PRN

## 2017-04-14 NOTE — Progress Notes (Signed)
[name], female DOB:04-Nov-1945, 71 y.o. EAV:[id number]  Chief Complaint  Patient presents with  . Follow-up    Coccyx    HPI  Valerie Huff is a 71 y.o. female who has chronic coccyx pain.  She still has pain in this area.  She had an epidural for her lower back pain problem.  She was scheduled to have second injection June 26 but did not go as her granddaughter was killed in a car accident a few days before that.  Her granddaughter was 7 months pregnant and the child did not survive either.  She is very depressed about this.  She has seen Dr. Margo Common her family doctor about this and has some medicine.   She is crying as she told me the events recently in her life. HPI  Body mass index is 29.35 kg/m.  ROS  Review of Systems  Constitutional:       Patient has Diabetes Mellitus. Patient has hypertension. Patient does not have COPD or shortness of breath. Patient does not have BMI > 35. Patient does not have current smoking history.  HENT: Negative for congestion.   Respiratory: Negative for cough and shortness of breath.   Cardiovascular: Negative for chest pain.  Endocrine: Positive for cold intolerance.  Musculoskeletal: Positive for arthralgias, back pain and joint swelling.  Allergic/Immunologic: Positive for environmental allergies.  Neurological: Positive for numbness.    Past Medical History:  Diagnosis Date  . Arthritis   . Diabetes mellitus without complication (HCC)   . Hypertension   . Neuropathy     History reviewed. No pertinent surgical history.  History reviewed. No pertinent family history.  Social History Social History  Substance Use Topics  . Smoking status: Never Smoker  . Smokeless tobacco: Never Used  . Alcohol use No    No Known Allergies  Current Outpatient Prescriptions  Medication Sig Dispense Refill  . atenolol (TENORMIN) 100 MG tablet Take 100 mg by mouth daily.    . cyclobenzaprine (FLEXERIL) 10 MG tablet Take 1  tablet (10 mg total) by mouth 3 (three) times daily. Take one tablet every 8 hours as needed for spasm. 40 tablet 0  . diclofenac (VOLTAREN) 75 MG EC tablet Take 1 tablet by mouth 2 (two) times daily.    Marland Kitchen glimepiride (AMARYL) 4 MG tablet Take 4 mg by mouth 2 (two) times daily.    Marland Kitchen HYDROcodone-acetaminophen (NORCO/VICODIN) 5-325 MG tablet Take 1 tablet by mouth every 6 (six) hours as needed for moderate pain (Must last 30 days.Do not take and drive a car or use machinery.). 100 tablet 0  . losartan (COZAAR) 100 MG tablet Take 100 mg by mouth daily.    . metFORMIN (GLUCOPHAGE) 500 MG tablet Take 1,000 mg by mouth 2 (two) times daily.    Marland Kitchen tiZANidine (ZANAFLEX) 4 MG capsule Take 4 mg by mouth.     No current facility-administered medications for this visit.      Physical Exam  Blood pressure (!) 181/73, pulse 61, temperature (!) 96.9 F (36.1 C), height 5\' 4"  (1.626 m), weight 171 lb (77.6 kg).  Constitutional: overall normal hygiene, normal nutrition, well developed, normal grooming, normal body habitus. Assistive device:none  Musculoskeletal: gait and station Limp none, muscle tone and strength are normal, no tremors or atrophy is present.  .  Neurological: coordination overall normal.  Deep tendon reflex/nerve stretch intact.  Sensation normal.  Cranial nerves II-XII intact.   Skin:   Normal overall no scars, lesions, ulcers or  rashes. No psoriasis.  Psychiatric: Alert and oriented x 3.  Recent memory intact, remote memory unclear.  Normal mood and affect. Well groomed.  Good eye contact.  Cardiovascular: overall no swelling, no varicosities, no edema bilaterally, normal temperatures of the legs and arms, no clubbing, cyanosis and good capillary refill.  Lymphatic: palpation is normal.  She is tender in the coccyx area.  NV intact.  The patient has been educated about the nature of the problem(s) and counseled on treatment options.  The patient appeared to understand what I have  discussed and is in agreement with it.  Encounter Diagnoses  Name Primary?  . Coccyalgia Yes  . Chronic right-sided low back pain with right-sided sciatica   . Acute depression     PLAN Call if any problems.  Precautions discussed.  Continue current medications.   Return to clinic 3 months   Electronically Signed Darreld McleanWayne Tyshawna Alarid, MD 7/12/201810:11 AM

## 2017-05-19 ENCOUNTER — Telehealth: Payer: Self-pay | Admitting: *Deleted

## 2017-05-19 DIAGNOSIS — M48062 Spinal stenosis, lumbar region with neurogenic claudication: Secondary | ICD-10-CM | POA: Diagnosis not present

## 2017-05-19 MED ORDER — HYDROCODONE-ACETAMINOPHEN 5-325 MG PO TABS: 1.0000 | ORAL_TABLET | Freq: Four times a day (QID) | ORAL | 0 refills | Status: DC | PRN

## 2017-05-19 NOTE — Telephone Encounter (Signed)
Patient called and states she needs a refill of her HYDROcodone-acetaminophen (NORCO/VICODIN) 5-325 MG tablet 100 tablet     Sig - Route: Take 1 tablet by mouth every 6 (six) hours as needed for moderate pain (Must last 30 days)

## 2017-06-13 DIAGNOSIS — M48062 Spinal stenosis, lumbar region with neurogenic claudication: Secondary | ICD-10-CM | POA: Diagnosis not present

## 2017-06-21 ENCOUNTER — Telehealth: Payer: Self-pay | Admitting: *Deleted

## 2017-06-21 NOTE — Telephone Encounter (Signed)
Patient called requesting her hydrocodone to be refilled.  

## 2017-06-23 ENCOUNTER — Telehealth: Payer: Self-pay | Admitting: Orthopaedic Surgery

## 2017-06-23 DIAGNOSIS — Z23 Encounter for immunization: Secondary | ICD-10-CM | POA: Diagnosis not present

## 2017-06-23 MED ORDER — HYDROCODONE-ACETAMINOPHEN 5-325 MG PO TABS: 1.0000 | ORAL_TABLET | Freq: Four times a day (QID) | ORAL | 0 refills | Status: DC | PRN

## 2017-06-23 NOTE — Telephone Encounter (Signed)
Patient requests refill:  °HYDROcodone-acetaminophen (NORCO/VICODIN) 5-325 MG tablet 90 tablet  ° ° °

## 2017-07-14 ENCOUNTER — Encounter: Payer: Self-pay | Admitting: Orthopaedic Surgery

## 2017-07-14 ENCOUNTER — Ambulatory Visit (INDEPENDENT_AMBULATORY_CARE_PROVIDER_SITE_OTHER): Payer: Worker's Compensation | Admitting: Orthopaedic Surgery

## 2017-07-14 VITALS — BP 185/79 | HR 65 | Temp 97.5°F | Ht 64.0 in | Wt 165.0 lb

## 2017-07-14 DIAGNOSIS — F329 Major depressive disorder, single episode, unspecified: Secondary | ICD-10-CM

## 2017-07-14 DIAGNOSIS — M533 Sacrococcygeal disorders, not elsewhere classified: Secondary | ICD-10-CM

## 2017-07-14 DIAGNOSIS — F32A Depression, unspecified: Secondary | ICD-10-CM

## 2017-07-14 NOTE — Progress Notes (Addendum)
Patient [name], female DOB:06/27/1946, 71 y.o. EAV:[id number]  Chief Complaint  Patient presents with  . Follow-up    COCCYX PAIN    HPI  Valerie Huff is a 71 y.o. female who has chronic coccyx pain.  She is stable with this.  She got her epidurals for the back pain but it did not help much.  She is in grief and depression still after the death of her only grandchild in a car accident.  Her granddaughter was 7 months pregnant and the child was lost also.  We have called Hospice and have asked for grief counseling.  They will help provide and we have given numbers for contact to the patient. HPI  Body mass index is 28.32 kg/m.  ROS  Review of Systems  Past Medical History:  Diagnosis Date  . Arthritis   . Diabetes mellitus without complication (HCC)   . Hypertension   . Neuropathy     History reviewed. No pertinent surgical history.  History reviewed. No pertinent family history.  Social History Social History  Substance Use Topics  . Smoking status: Never Smoker  . Smokeless tobacco: Never Used  . Alcohol use No    No Known Allergies  Current Outpatient Prescriptions  Medication Sig Dispense Refill  . atenolol (TENORMIN) 100 MG tablet Take 100 mg by mouth daily.    . cyclobenzaprine (FLEXERIL) 10 MG tablet Take 1 tablet (10 mg total) by mouth 3 (three) times daily. Take one tablet every 8 hours as needed for spasm. 40 tablet 0  . diclofenac (VOLTAREN) 75 MG EC tablet Take 1 tablet by mouth 2 (two) times daily.    Marland Kitchen glimepiride (AMARYL) 4 MG tablet Take 4 mg by mouth 2 (two) times daily.    Marland Kitchen HYDROcodone-acetaminophen (NORCO/VICODIN) 5-325 MG tablet Take 1 tablet by mouth every 6 (six) hours as needed for moderate pain (Must last 30 days.Do not take and drive a car or use machinery.). 90 tablet 0  . losartan (COZAAR) 100 MG tablet Take 100 mg by mouth daily.    . metFORMIN (GLUCOPHAGE) 500 MG tablet Take 1,000 mg by mouth 2 (two) times daily.    Marland Kitchen  tiZANidine (ZANAFLEX) 4 MG capsule Take 4 mg by mouth.     No current facility-administered medications for this visit.      Physical Exam  Blood pressure (!) 185/79, pulse 65, temperature (!) 97.5 F (36.4 C), height  (1.626 m), weight 165 lb (74.8 kg).  Constitutional: overall normal hygiene, normal nutrition, well developed, normal grooming, normal body habitus. Assistive device:none  Musculoskeletal: gait and station Limp none, muscle tone and strength are normal, no tremors or atrophy is present.  .  Neurological: coordination overall normal.  Deep tendon reflex/nerve stretch intact.  Sensation normal.  Cranial nerves II-XII intact.   Skin:   Normal overall no scars, lesions, ulcers or rashes. No psoriasis.  Psychiatric: Alert and oriented x 3.  Recent memory intact, remote memory unclear.  Normal mood and affect. Well groomed.  Good eye contact.  Cardiovascular: overall no swelling, no varicosities, no edema bilaterally, normal temperatures of the legs and arms, no clubbing, cyanosis and good capillary refill.  Lymphatic: palpation is normal.  All other systems reviewed and are negative   She has coccyx pain.  Gait is good.  NV intact.  The patient has been educated about the nature of the problem(s) and counseled on treatment options.  The patient appeared to understand what I have discussed and is  in agreement with it.  Encounter Diagnoses  Name Primary?  . Coccyalgia Yes  . Acute depression     PLAN Call if any problems.  Precautions discussed.  Continue current medications.   Return to clinic 3 months   Electronically Signed Darreld Mclean, MD 10/11/201810:05 AM  After making arrangements with [facility] and scheduling things, the patient refused to pursue it.  Electronically Signed Darreld Mclean, MD 10/11/201810:25 AM

## 2017-07-26 ENCOUNTER — Telehealth: Payer: Self-pay | Admitting: Orthopaedic Surgery

## 2017-07-26 MED ORDER — HYDROCODONE-ACETAMINOPHEN 5-325 MG PO TABS: 1.0000 | ORAL_TABLET | Freq: Four times a day (QID) | ORAL | 0 refills | Status: DC | PRN

## 2017-07-26 NOTE — Telephone Encounter (Signed)
Patient called for refill:  °HYDROcodone-acetaminophen (NORCO/VICODIN) 5-325 MG tablet 90 tablet  ° ° °

## 2017-08-09 DIAGNOSIS — G8929 Other chronic pain: Secondary | ICD-10-CM | POA: Diagnosis not present

## 2017-08-09 DIAGNOSIS — E118 Type 2 diabetes mellitus with unspecified complications: Secondary | ICD-10-CM | POA: Diagnosis not present

## 2017-08-09 DIAGNOSIS — R52 Pain, unspecified: Secondary | ICD-10-CM | POA: Diagnosis not present

## 2017-08-09 DIAGNOSIS — I10 Essential (primary) hypertension: Secondary | ICD-10-CM | POA: Diagnosis not present

## 2017-08-09 DIAGNOSIS — I739 Peripheral vascular disease, unspecified: Secondary | ICD-10-CM | POA: Diagnosis not present

## 2017-08-09 DIAGNOSIS — E7800 Pure hypercholesterolemia, unspecified: Secondary | ICD-10-CM | POA: Diagnosis not present

## 2017-08-11 DIAGNOSIS — M48062 Spinal stenosis, lumbar region with neurogenic claudication: Secondary | ICD-10-CM | POA: Diagnosis not present

## 2017-08-11 DIAGNOSIS — I10 Essential (primary) hypertension: Secondary | ICD-10-CM | POA: Diagnosis not present

## 2017-08-11 DIAGNOSIS — Z6830 Body mass index (BMI) 30.0-30.9, adult: Secondary | ICD-10-CM | POA: Diagnosis not present

## 2017-08-28 DIAGNOSIS — S91311A Laceration without foreign body, right foot, initial encounter: Secondary | ICD-10-CM | POA: Diagnosis not present

## 2017-08-28 DIAGNOSIS — W01198A Fall on same level from slipping, tripping and stumbling with subsequent striking against other object, initial encounter: Secondary | ICD-10-CM | POA: Diagnosis not present

## 2017-08-29 ENCOUNTER — Telehealth: Payer: Self-pay | Admitting: Orthopaedic Surgery

## 2017-08-29 NOTE — Telephone Encounter (Signed)
Hydrocodone-Acetaminophen 5/325 mg Qty 75 Tablets °

## 2017-08-30 MED ORDER — HYDROCODONE-ACETAMINOPHEN 5-325 MG PO TABS: 1.0000 | ORAL_TABLET | Freq: Four times a day (QID) | ORAL | 0 refills | Status: DC | PRN

## 2017-09-21 ENCOUNTER — Telehealth: Payer: Self-pay | Admitting: Orthopaedic Surgery

## 2017-09-21 NOTE — Telephone Encounter (Signed)
Patient asking if prescription for medication may be refilled:  HYDROcodone-acetaminophen (NORCO/VICODIN) 5-325 MG tablet 65 tablet   - [facility]

## 2017-09-22 MED ORDER — HYDROCODONE-ACETAMINOPHEN 5-325 MG PO TABS: 1.0000 | ORAL_TABLET | Freq: Four times a day (QID) | ORAL | 0 refills | Status: DC | PRN

## 2017-10-03 DIAGNOSIS — R609 Edema, unspecified: Secondary | ICD-10-CM | POA: Diagnosis not present

## 2017-10-03 DIAGNOSIS — I872 Venous insufficiency (chronic) (peripheral): Secondary | ICD-10-CM | POA: Diagnosis not present

## 2017-10-05 DIAGNOSIS — I739 Peripheral vascular disease, unspecified: Secondary | ICD-10-CM | POA: Diagnosis not present

## 2017-10-05 DIAGNOSIS — E1165 Type 2 diabetes mellitus with hyperglycemia: Secondary | ICD-10-CM | POA: Diagnosis not present

## 2017-10-05 DIAGNOSIS — E1140 Type 2 diabetes mellitus with diabetic neuropathy, unspecified: Secondary | ICD-10-CM | POA: Diagnosis not present

## 2017-10-13 ENCOUNTER — Ambulatory Visit (INDEPENDENT_AMBULATORY_CARE_PROVIDER_SITE_OTHER): Payer: Worker's Compensation | Admitting: Orthopaedic Surgery

## 2017-10-13 ENCOUNTER — Encounter: Payer: Self-pay | Admitting: Orthopaedic Surgery

## 2017-10-13 VITALS — BP 189/81 | HR 65 | Ht 64.0 in | Wt 172.0 lb

## 2017-10-13 DIAGNOSIS — M533 Sacrococcygeal disorders, not elsewhere classified: Secondary | ICD-10-CM

## 2017-10-13 NOTE — Progress Notes (Signed)
Patient WU:JWJX:Valerie Huff, female DOB:01/20/1946, 72 y.o. BJY:[id number]  Chief Complaint  Patient presents with  . Follow-up    coccyx pain    HPI  Valerie Huff is a 72 y.o. female who has coccyx pain chronically.  She has had more pain recently for some unknown reason.  She tries to sit in a padded chair and not sit too long.  She has no paresthesias.  She is taking her medicine. HPI  Body mass index is 29.52 kg/m.  ROS  Review of Systems  Constitutional:       Patient has Diabetes Mellitus. Patient has hypertension. Patient does not have COPD or shortness of breath. Patient does not have BMI > 35. Patient does not have current smoking history.  HENT: Negative for congestion.   Respiratory: Negative for cough and shortness of breath.   Cardiovascular: Negative for chest pain.  Endocrine: Positive for cold intolerance.  Musculoskeletal: Positive for arthralgias, back pain and joint swelling.  Allergic/Immunologic: Positive for environmental allergies.  Neurological: Positive for numbness.  All other systems reviewed and are negative.   Past Medical History:  Diagnosis Date  . Arthritis   . Diabetes mellitus without complication (HCC)   . Hypertension   . Neuropathy     History reviewed. No pertinent surgical history.  History reviewed. No pertinent family history.  Social History Social History   Tobacco Use  . Smoking status: Never Smoker  . Smokeless tobacco: Never Used  Substance Use Topics  . Alcohol use: No  . Drug use: No    No Known Allergies  Current Outpatient Medications  Medication Sig Dispense Refill  . atenolol (TENORMIN) 100 MG tablet Take 100 mg by mouth daily.    . cyclobenzaprine (FLEXERIL) 10 MG tablet Take 1 tablet (10 mg total) by mouth 3 (three) times daily. Take one tablet every 8 hours as needed for spasm. 40 tablet 0  . diclofenac (VOLTAREN) 75 MG EC tablet Take 1 tablet by mouth 2 (two) times daily.    Marland Kitchen. glimepiride (AMARYL) 4  MG tablet Take 4 mg by mouth 2 (two) times daily.    Marland Kitchen. HYDROcodone-acetaminophen (NORCO/VICODIN) 5-325 MG tablet Take 1 tablet by mouth every 6 (six) hours as needed for moderate pain (Must last 30 days.Do not take and drive a car or use machinery.). 60 tablet 0  . losartan (COZAAR) 100 MG tablet Take 100 mg by mouth daily.    . metFORMIN (GLUCOPHAGE) 500 MG tablet Take 1,000 mg by mouth 2 (two) times daily.    Marland Kitchen. tiZANidine (ZANAFLEX) 4 MG capsule Take 4 mg by mouth.     No current facility-administered medications for this visit.      Physical Exam  Blood pressure (!) 189/81, pulse 65, height 5\' 4"  (1.626 m), weight 172 lb (78 kg).  Constitutional: overall normal hygiene, normal nutrition, well developed, normal grooming, normal body habitus. Assistive device:none  Musculoskeletal: gait and station Limp none, muscle tone and strength are normal, no tremors or atrophy is present.  .  Neurological: coordination overall normal.  Deep tendon reflex/nerve stretch intact.  Sensation normal.  Cranial nerves II-XII intact.   Skin:   Normal overall no scars, lesions, ulcers or rashes. No psoriasis.  Psychiatric: Alert and oriented x 3.  Recent memory intact, remote memory unclear.  Normal mood and affect. Well groomed.  Good eye contact.  Cardiovascular: overall no swelling, no varicosities, no edema bilaterally, normal temperatures of the legs and arms, no clubbing, cyanosis and good capillary  refill.  Lymphatic: palpation is normal.  She has coccyx pain and the lower back is negative today.  All other systems reviewed and are negative   The patient has been educated about the nature of the problem(s) and counseled on treatment options.  The patient appeared to understand what I have discussed and is in agreement with it.  Encounter Diagnosis  Name Primary?  . Coccyalgia Yes    PLAN Call if any problems.  Precautions discussed.  Continue current medications.   Return to clinic 4  months.   Electronically Signed Darreld Mclean, MD 1/10/201910:35 AM

## 2017-11-03 ENCOUNTER — Telehealth: Payer: Self-pay | Admitting: Orthopaedic Surgery

## 2017-11-03 NOTE — Telephone Encounter (Signed)
Patient called this afternoon and requested a refill on Hydrocodone/Acetaminophen 5-325  Mgs.  Qty  60  Sig: Take 1 tablet by mouth every 6 (six) hours as needed for moderate pain (Must last 30 days.Do not take and drive a car or use machinery.).        Patient states she uses [facility] in [place]

## 2017-11-08 MED ORDER — HYDROCODONE-ACETAMINOPHEN 5-325 MG PO TABS: 1.0000 | ORAL_TABLET | Freq: Four times a day (QID) | ORAL | 0 refills | Status: DC | PRN

## 2017-11-22 ENCOUNTER — Telehealth: Payer: Self-pay | Admitting: Radiology

## 2017-11-22 NOTE — Telephone Encounter (Signed)
The patient called stating a box had aririved for her containing a Pneumatic Compressor.  The packaging had Dr. Sanjuan DameKeeling's name as the ordering physician.  I looked at her notes and told her that Dr. Hilda LiasKeeling did not order this device.  I asked her the name of the company that sent it to her and she did not know.  The paperwork was in the other room.  I asked her to call me back with the name of the company so I can call them and see what is going on.

## 2017-11-22 NOTE — Telephone Encounter (Signed)
The patient called back stating she could not find the paperwork that had Dr. Sanjuan DameKeeling's name on it regarding the pneumonic compression device that was shipped to her. (see previous note. Dr. Hilda LiasKeeling did not order this.)   She will bring it in to me if she finds it.  She also stated that she could have made a mistake about it having Dr. Sanjuan DameKeeling's name as the ordering provider since she can not find the papers.  It came from [facility].  She is going to send it back.

## 2017-12-15 ENCOUNTER — Other Ambulatory Visit: Payer: Self-pay | Admitting: Orthopedic Surgery

## 2017-12-15 MED ORDER — HYDROCODONE-ACETAMINOPHEN 5-325 MG PO TABS: 1.0000 | ORAL_TABLET | Freq: Four times a day (QID) | ORAL | 0 refills | Status: DC | PRN

## 2017-12-15 NOTE — Telephone Encounter (Signed)
Patient of Dr. Sanjuan DameKeeling's requests refill on Hydrocodone/Acetaminophen 5-325   Mgs.   Qty  60  Sig: Take 1 tablet by mouth every 6 (six) hours as needed for moderate pain (Must last 30 days.Do not take and drive a car or use machinery.).  Patient states she uses [facility] in [place]

## 2017-12-15 NOTE — Telephone Encounter (Signed)
ok 

## 2017-12-15 NOTE — Progress Notes (Signed)
norco

## 2017-12-16 ENCOUNTER — Other Ambulatory Visit: Payer: Self-pay | Admitting: Orthopedic Surgery

## 2017-12-16 MED ORDER — HYDROCODONE-ACETAMINOPHEN 5-325 MG PO TABS: 1.0000 | ORAL_TABLET | Freq: Four times a day (QID) | ORAL | 0 refills | Status: DC | PRN

## 2017-12-16 NOTE — Telephone Encounter (Signed)
Done again

## 2017-12-16 NOTE — Telephone Encounter (Signed)
I DID THIS ONE

## 2018-01-12 ENCOUNTER — Other Ambulatory Visit: Payer: Self-pay | Admitting: Orthopaedic Surgery

## 2018-01-12 MED ORDER — HYDROCODONE-ACETAMINOPHEN 5-325 MG PO TABS: 1.0000 | ORAL_TABLET | Freq: Four times a day (QID) | ORAL | 0 refills | Status: DC | PRN

## 2018-01-12 NOTE — Telephone Encounter (Signed)
Hydrocodone-Acetaminophen  5/325mg   Qty 60 Tablets  Take 1 tablet by mouth every 6(six) hours as needed for moderate pain.    PATIENT USES [facility]

## 2018-02-09 ENCOUNTER — Ambulatory Visit: Payer: Worker's Compensation | Admitting: Orthopaedic Surgery

## 2018-02-09 ENCOUNTER — Other Ambulatory Visit: Payer: Self-pay | Admitting: Orthopaedic Surgery

## 2018-02-09 MED ORDER — HYDROCODONE-ACETAMINOPHEN 5-325 MG PO TABS: 1.0000 | ORAL_TABLET | Freq: Four times a day (QID) | ORAL | 0 refills | Status: DC | PRN

## 2018-02-09 NOTE — Telephone Encounter (Signed)
Hydrocodone-Acetaminophen 5/325 mg  Qty 60 Tablets  Take 1 tablet by mouth every 6(six) hours as needed for moderate pain.  PATIENT USES [facility] IN [place]

## 2018-02-14 ENCOUNTER — Telehealth: Payer: Self-pay | Admitting: Orthopaedic Surgery

## 2018-02-14 NOTE — Telephone Encounter (Signed)
Call received from [facility], Nature conservation officer for Corporate treasurer with [employer]. States following up on order for equipment prescribed by Dr Hilda Lias at time of last office visit.  Aware that Dr Hilda Lias has been out of clinic until today.  States patient appears to have declined the item.  Please call Marcelino Duster at ph#[phone].

## 2018-02-16 ENCOUNTER — Encounter: Payer: Self-pay | Admitting: Orthopaedic Surgery

## 2018-02-16 ENCOUNTER — Ambulatory Visit (INDEPENDENT_AMBULATORY_CARE_PROVIDER_SITE_OTHER): Payer: Worker's Compensation | Admitting: Orthopaedic Surgery

## 2018-02-16 VITALS — BP 190/72 | HR 75 | Ht 64.0 in | Wt 170.0 lb

## 2018-02-16 DIAGNOSIS — M533 Sacrococcygeal disorders, not elsewhere classified: Secondary | ICD-10-CM

## 2018-02-16 NOTE — Progress Notes (Signed)
Patient [name], female DOB:1946/07/04, 72 y.o. EAV:[id number]  Chief Complaint  Patient presents with  . Tailbone Pain    c/o pain  . Medication Refill    upset about Hydrocodone dose decrease by Dr Romeo Apple    HPI  Valerie Huff is a 72 y.o. female who has chronic coccyx pain.  She has had an increase in pain recently.  Her pain medicine was changed in my absence.  She has no new trauma.  She is trying to be active. HPI  Body mass index is 29.18 kg/m.  ROS  Review of Systems  Constitutional:       Patient has Diabetes Mellitus. Patient has hypertension. Patient does not have COPD or shortness of breath. Patient does not have BMI > 35. Patient does not have current smoking history.  HENT: Negative for congestion.   Respiratory: Negative for cough and shortness of breath.   Cardiovascular: Negative for chest pain.  Endocrine: Positive for cold intolerance.  Musculoskeletal: Positive for arthralgias, back pain and joint swelling.  Allergic/Immunologic: Positive for environmental allergies.  Neurological: Positive for numbness.  All other systems reviewed and are negative.   Past Medical History:  Diagnosis Date  . Arthritis   . Diabetes mellitus without complication (HCC)   . Hypertension   . Neuropathy     History reviewed. No pertinent surgical history.  History reviewed. No pertinent family history.  Social History Social History   Tobacco Use  . Smoking status: Never Smoker  . Smokeless tobacco: Never Used  Substance Use Topics  . Alcohol use: No  . Drug use: No    No Known Allergies  Current Outpatient Medications  Medication Sig Dispense Refill  . amLODipine (NORVASC) 5 MG tablet     . atenolol (TENORMIN) 100 MG tablet Take 100 mg by mouth daily.    . cyclobenzaprine (FLEXERIL) 10 MG tablet Take 1 tablet (10 mg total) by mouth 3 (three) times daily. Take one tablet every 8 hours as needed for spasm. 40 tablet 0  . glimepiride (AMARYL) 4  MG tablet Take 4 mg by mouth 2 (two) times daily.    Marland Kitchen HYDROcodone-acetaminophen (NORCO/VICODIN) 5-325 MG tablet Take 1 tablet by mouth every 6 (six) hours as needed for moderate pain. 60 tablet 0  . losartan (COZAAR) 100 MG tablet Take 100 mg by mouth daily.    . metFORMIN (GLUCOPHAGE) 500 MG tablet Take 1,000 mg by mouth 2 (two) times daily.    Marland Kitchen tiZANidine (ZANAFLEX) 4 MG capsule Take 4 mg by mouth.    . diclofenac (VOLTAREN) 75 MG EC tablet Take 1 tablet by mouth 2 (two) times daily.     No current facility-administered medications for this visit.      Physical Exam  Blood pressure (!) 190/72, pulse 75, height  (1.626 m), weight 170 lb (77.1 kg).  Constitutional: overall normal hygiene, normal nutrition, well developed, normal grooming, normal body habitus. Assistive device:none  Musculoskeletal: gait and station Limp none, muscle tone and strength are normal, no tremors or atrophy is present.  .  Neurological: coordination overall normal.  Deep tendon reflex/nerve stretch intact.  Sensation normal.  Cranial nerves II-XII intact.   Skin:   Normal overall no scars, lesions, ulcers or rashes. No psoriasis.  Psychiatric: Alert and oriented x 3.  Recent memory intact, remote memory unclear.  Normal mood and affect. Well groomed.  Good eye contact.  Cardiovascular: overall no swelling, no varicosities, no edema bilaterally, normal temperatures of the legs  and arms, no clubbing, cyanosis and good capillary refill.  Lymphatic: palpation is normal.  She has coccyx pain and cannot sit long.  NV intact.  All other systems reviewed and are negative   The patient has been educated about the nature of the problem(s) and counseled on treatment options.  The patient appeared to understand what I have discussed and is in agreement with it.  Encounter Diagnosis  Name Primary?  . Coccyalgia Yes    PLAN Call if any problems.  Precautions discussed.  Continue current medications.    Return to clinic 3 months   Electronically Signed Darreld Mclean, MD 5/16/20192:25 PM

## 2018-02-16 NOTE — Telephone Encounter (Signed)
I spoke with Marcelino Duster regarding this.

## 2018-02-23 ENCOUNTER — Telehealth: Payer: Self-pay | Admitting: Orthopaedic Surgery

## 2018-02-23 MED ORDER — HYDROCODONE-ACETAMINOPHEN 5-325 MG PO TABS: 1.0000 | ORAL_TABLET | Freq: Four times a day (QID) | ORAL | 0 refills | Status: DC | PRN

## 2018-02-23 NOTE — Telephone Encounter (Signed)
Done today already

## 2018-02-23 NOTE — Telephone Encounter (Signed)
Patient states she checked with her Pharmacy about Worker's Comp decreasing her medication and states they did not decrease. She said you needed to know this information.  Hydrocodone-Acetaminophen 5/325 mg  Qty 60 Tablets?  PATIENT USES [facility] IN [place]

## 2018-03-30 ENCOUNTER — Telehealth: Payer: Self-pay | Admitting: Orthopaedic Surgery

## 2018-03-30 MED ORDER — HYDROCODONE-ACETAMINOPHEN 5-325 MG PO TABS: 1.0000 | ORAL_TABLET | Freq: Four times a day (QID) | ORAL | 0 refills | Status: DC | PRN

## 2018-03-30 NOTE — Telephone Encounter (Signed)
Hydrocodone-Acetaminophen 5/325 mg  Qty 75 Tablets  PATIENT USES [facility]

## 2018-04-27 ENCOUNTER — Telehealth: Payer: Self-pay | Admitting: Orthopaedic Surgery

## 2018-04-27 MED ORDER — HYDROCODONE-ACETAMINOPHEN 5-325 MG PO TABS: 1.0000 | ORAL_TABLET | Freq: Four times a day (QID) | ORAL | 0 refills | Status: DC | PRN

## 2018-04-27 NOTE — Telephone Encounter (Signed)
Hydrocodone-Acetaminophen  5/325 mg  Qty 75 Tablets  PATIENT USES [facility]

## 2018-05-11 ENCOUNTER — Telehealth: Payer: Self-pay | Admitting: Radiology

## 2018-05-11 NOTE — Telephone Encounter (Signed)
[phone] Dana from [facility] called about possible fraudulent signature from Dr Hilda LiasKeeling. She indicates patient has not returned the equipment if it was an error, she would have needed to call her. I told her I can not advise whether this was fraudulent or not there are several messages in the chart, and I told her you will call her back when you are in the office again.

## 2018-05-18 ENCOUNTER — Ambulatory Visit (INDEPENDENT_AMBULATORY_CARE_PROVIDER_SITE_OTHER): Payer: Self-pay | Admitting: Orthopaedic Surgery

## 2018-05-18 ENCOUNTER — Encounter: Payer: Self-pay | Admitting: Orthopaedic Surgery

## 2018-05-18 VITALS — BP 209/83 | HR 69 | Ht 63.0 in | Wt 170.0 lb

## 2018-05-18 DIAGNOSIS — M533 Sacrococcygeal disorders, not elsewhere classified: Secondary | ICD-10-CM

## 2018-05-18 MED ORDER — HYDROCODONE-ACETAMINOPHEN 7.5-325 MG PO TABS: 1.0000 | ORAL_TABLET | Freq: Four times a day (QID) | ORAL | 0 refills | Status: DC | PRN

## 2018-05-18 NOTE — Progress Notes (Signed)
Patient ZO:XWRU:Valerie Huff, female DOB:05/01/1946, 72 y.o. EAV:[id number]  Chief Complaint  Patient presents with  . Tailbone Pain    HPI  Valerie Huff is a 72 y.o. female who has chronic coccyx pain.  Her worker's comp claim has been settled and she is paying her bill now.  She has continued pain in the coccyx area.  She has no new trauma.  She has no weakness.   Body mass index is 30.11 kg/m.  ROS  Review of Systems  All other systems reviewed and are negative.  The following is a summary of the past history medically, past history surgically, known current medicines, social history and family history.  This information is gathered electronically by the computer from prior information and documentation.  I review this each visit and have found including this information at this point in the chart is beneficial and informative.    Past Medical History:  Diagnosis Date  . Arthritis   . Diabetes mellitus without complication (HCC)   . Hypertension   . Neuropathy     History reviewed. No pertinent surgical history.  Family History  Problem Relation Age of Onset  . Pneumonia Mother   . Alzheimer's disease Father     Social History Social History   Tobacco Use  . Smoking status: Never Smoker  . Smokeless tobacco: Never Used  Substance Use Topics  . Alcohol use: No  . Drug use: No    No Known Allergies  Current Outpatient Medications  Medication Sig Dispense Refill  . amLODipine (NORVASC) 5 MG tablet     . atenolol (TENORMIN) 100 MG tablet Take 100 mg by mouth daily.    . cyclobenzaprine (FLEXERIL) 10 MG tablet Take 1 tablet (10 mg total) by mouth 3 (three) times daily. Take one tablet every 8 hours as needed for spasm. 40 tablet 0  . diclofenac (VOLTAREN) 75 MG EC tablet Take 1 tablet by mouth 2 (two) times daily.    Marland Kitchen. glimepiride (AMARYL) 4 MG tablet Take 4 mg by mouth 2 (two) times daily.    Marland Kitchen. losartan (COZAAR) 100 MG tablet Take 100 mg by mouth daily.    .  metFORMIN (GLUCOPHAGE) 500 MG tablet Take 1,000 mg by mouth 2 (two) times daily.    Marland Kitchen. tiZANidine (ZANAFLEX) 4 MG capsule Take 4 mg by mouth.    Marland Kitchen. HYDROcodone-acetaminophen (NORCO) 7.5-325 MG tablet Take 1 tablet by mouth every 6 (six) hours as needed for moderate pain (Must last 30 days.). 100 tablet 0   No current facility-administered medications for this visit.      Physical Exam  Blood pressure (!) 209/83, pulse 69, height 5\' 3"  (1.6 m), weight 170 lb (77.1 kg).  Constitutional: overall normal hygiene, normal nutrition, well developed, normal grooming, normal body habitus. Assistive device:none  Musculoskeletal: gait and station Limp none, muscle tone and strength are normal, no tremors or atrophy is present.  .  Neurological: coordination overall normal.  Deep tendon reflex/nerve stretch intact.  Sensation normal.  Cranial nerves II-XII intact.   Skin:   Normal overall no scars, lesions, ulcers or rashes. No psoriasis.  Psychiatric: Alert and oriented x 3.  Recent memory intact, remote memory unclear.  Normal mood and affect. Well groomed.  Good eye contact.  Cardiovascular: overall no swelling, no varicosities, no edema bilaterally, normal temperatures of the legs and arms, no clubbing, cyanosis and good capillary refill.  Lymphatic: palpation is normal.  She has coccyx pain.  Gait is normal.  Spine/Pelvis  examination:  Inspection:  Overall, sacoiliac joint benign and hips nontender; without crepitus or defects.   Thoracic spine inspection: Alignment normal without kyphosis present   Lumbar spine inspection:  Alignment  with normal lumbar lordosis, without scoliosis apparent.   Thoracic spine palpation:  without tenderness of spinal processes   Lumbar spine palpation: without tenderness of lumbar area; without tightness of lumbar muscles    Range of Motion:   Lumbar flexion, forward flexion is normal without pain or tenderness    Lumbar extension is full without pain  or tenderness   Left lateral bend is normal without pain or tenderness   Right lateral bend is normal without pain or tenderness   Straight leg raising is normal  Strength & tone: normal   Stability overall normal stability All other systems reviewed and are negative   The patient has been educated about the nature of the problem(s) and counseled on treatment options.  The patient appeared to understand what I have discussed and is in agreement with it.  Encounter Diagnosis  Name Primary?  . Coccyalgia Yes    PLAN Call if any problems.  Precautions discussed.  Continue current medications.   Return to clinic 3 months   I have reviewed the [facility] [place] web site prior to prescribing narcotic medicine for this patient.  The patient has read and signed an Opioid Treatment Agreement which has been scanned and added to the medical record.  The patient understands the agreement and agrees to abide with it.  The patient has chronic pain that is being treated with an opioid which relieves the pain.  The patient understands potential complications with chronic opioid treatment.  Electronically Signed Darreld McleanWayne Antania Hoefling, MD 8/15/20192:35 PM

## 2018-05-31 NOTE — Telephone Encounter (Signed)
I called and left a message for Valerie Huff to call me back from [facility].on 05/17/18

## 2018-06-19 DIAGNOSIS — E1142 Type 2 diabetes mellitus with diabetic polyneuropathy: Secondary | ICD-10-CM | POA: Diagnosis not present

## 2018-06-19 DIAGNOSIS — J301 Allergic rhinitis due to pollen: Secondary | ICD-10-CM | POA: Diagnosis not present

## 2018-06-19 DIAGNOSIS — Z23 Encounter for immunization: Secondary | ICD-10-CM | POA: Diagnosis not present

## 2018-06-22 ENCOUNTER — Telehealth: Payer: Self-pay | Admitting: Orthopaedic Surgery

## 2018-06-22 MED ORDER — HYDROCODONE-ACETAMINOPHEN 7.5-325 MG PO TABS: 1.0000 | ORAL_TABLET | Freq: Four times a day (QID) | ORAL | 0 refills | Status: DC | PRN

## 2018-06-22 NOTE — Telephone Encounter (Signed)
Hydrocodone-Acetaminophen  7.5/325 mg  Qty  100 Tablet  PATIENT USES [facility]

## 2018-07-19 ENCOUNTER — Telehealth: Payer: Self-pay | Admitting: Orthopaedic Surgery

## 2018-07-19 NOTE — Telephone Encounter (Signed)
Patient called for refill:  HYDROcodone-acetaminophen (NORCO) 7.5-325 MG tablet 100 tablet  -[facility]

## 2018-07-20 MED ORDER — HYDROCODONE-ACETAMINOPHEN 7.5-325 MG PO TABS: 1.0000 | ORAL_TABLET | Freq: Four times a day (QID) | ORAL | 0 refills | Status: DC | PRN

## 2018-08-16 ENCOUNTER — Telehealth: Payer: Self-pay | Admitting: Orthopaedic Surgery

## 2018-08-16 MED ORDER — HYDROCODONE-ACETAMINOPHEN 7.5-325 MG PO TABS: 1.0000 | ORAL_TABLET | Freq: Four times a day (QID) | ORAL | 0 refills | Status: DC | PRN

## 2018-08-16 NOTE — Telephone Encounter (Signed)
Patient called and states her pain medication is due again this weekend and she will need a refill on Hydrocodone/Acetaminophen 7.5-325 mgs.  Qty  100       Sig: Take 1 tablet by mouth every 6 (six) hours as needed for moderate pain (Must last 30 days.).    Patient states she uses [facility] in [place]

## 2018-08-16 NOTE — Addendum Note (Signed)
Addended by: Earnstine RegalKEELING, JOHN W on: 08/16/2018 04:18 PM   Modules accepted: Orders

## 2018-08-22 ENCOUNTER — Encounter: Payer: Self-pay | Admitting: Orthopaedic Surgery

## 2018-08-22 ENCOUNTER — Ambulatory Visit (INDEPENDENT_AMBULATORY_CARE_PROVIDER_SITE_OTHER): Payer: Self-pay | Admitting: Orthopaedic Surgery

## 2018-08-22 VITALS — BP 170/87 | HR 63 | Ht 63.0 in | Wt 170.0 lb

## 2018-08-22 DIAGNOSIS — M533 Sacrococcygeal disorders, not elsewhere classified: Secondary | ICD-10-CM

## 2018-08-22 NOTE — Progress Notes (Signed)
[name], female DOB:03/01/1946, 72 y.o. WGN:[id number]  Chief Complaint  Patient presents with  . Tailbone Pain    Coccyx pain    HPI  Valerie Huff is a 72 y.o. female who has chronic coccyx pain. She is stable.  She sat wrong a few days ago and has more pain today.  She knows what to do.  It usually takes a week for her pain to calm down.  She is taking her medicine and being active.   Body mass index is 30.11 kg/m.  ROS  Review of Systems  Constitutional:       Patient has Diabetes Mellitus. Patient has hypertension. Patient does not have COPD or shortness of breath. Patient does not have BMI > 35. Patient does not have current smoking history.  HENT: Negative for congestion.   Respiratory: Negative for cough and shortness of breath.   Cardiovascular: Negative for chest pain.  Endocrine: Positive for cold intolerance.  Musculoskeletal: Positive for arthralgias, back pain and joint swelling.  Allergic/Immunologic: Positive for environmental allergies.  Neurological: Positive for numbness.  All other systems reviewed and are negative.   All other systems reviewed and are negative.  The following is a summary of the past history medically, past history surgically, known current medicines, social history and family history.  This information is gathered electronically by the computer from prior information and documentation.  I review this each visit and have found including this information at this point in the chart is beneficial and informative.    Past Medical History:  Diagnosis Date  . Arthritis   . Diabetes mellitus without complication (HCC)   . Hypertension   . Neuropathy     History reviewed. No pertinent surgical history.  Family History  Problem Relation Age of Onset  . Pneumonia Mother   . Alzheimer's disease Father     Social History Social History   Tobacco Use  . Smoking status: Never Smoker  . Smokeless tobacco: Never Used   Substance Use Topics  . Alcohol use: No  . Drug use: No    No Known Allergies  Current Outpatient Medications  Medication Sig Dispense Refill  . amLODipine (NORVASC) 5 MG tablet     . atenolol (TENORMIN) 100 MG tablet Take 100 mg by mouth daily.    . cyclobenzaprine (FLEXERIL) 10 MG tablet Take 1 tablet (10 mg total) by mouth 3 (three) times daily. Take one tablet every 8 hours as needed for spasm. 40 tablet 0  . diclofenac (VOLTAREN) 75 MG EC tablet Take 1 tablet by mouth 2 (two) times daily.    Marland Kitchen glimepiride (AMARYL) 4 MG tablet Take 4 mg by mouth 2 (two) times daily.    Marland Kitchen HYDROcodone-acetaminophen (NORCO) 7.5-325 MG tablet Take 1 tablet by mouth every 6 (six) hours as needed for moderate pain (Must last 30 days.). 100 tablet 0  . losartan (COZAAR) 100 MG tablet Take 100 mg by mouth daily.    . metFORMIN (GLUCOPHAGE) 500 MG tablet Take 1,000 mg by mouth 2 (two) times daily.    Marland Kitchen tiZANidine (ZANAFLEX) 4 MG capsule Take 4 mg by mouth.     No current facility-administered medications for this visit.      Physical Exam  Blood pressure (!) 170/87, pulse 63, height 5\' 3"  (1.6 m), weight 170 lb (77.1 kg).  Constitutional: overall normal hygiene, normal nutrition, well developed, normal grooming, normal body habitus. Assistive device:none  Musculoskeletal: gait and station Limp none, muscle tone and strength  are normal, no tremors or atrophy is present.  .  Neurological: coordination overall normal.  Deep tendon reflex/nerve stretch intact.  Sensation normal.  Cranial nerves II-XII intact.   Skin:   Normal overall no scars, lesions, ulcers or rashes. No psoriasis.  Psychiatric: Alert and oriented x 3.  Recent memory intact, remote memory unclear.  Normal mood and affect. Well groomed.  Good eye contact.  Cardiovascular: overall no swelling, no varicosities, no edema bilaterally, normal temperatures of the legs and arms, no clubbing, cyanosis and good capillary refill.  Lymphatic:  palpation is normal.  She has coccyx tenderness.  Lower back negative.  NV intact.  All other systems reviewed and are negative   The patient has been educated about the nature of the problem(s) and counseled on treatment options.  The patient appeared to understand what I have discussed and is in agreement with it.  Encounter Diagnosis  Name Primary?  . Coccyalgia Yes    PLAN Call if any problems.  Precautions discussed.  Continue current medications.   Return to clinic 3 months   Electronically Signed Darreld McleanWayne Dezaria Methot, MD 11/19/20192:07 PM

## 2018-08-29 ENCOUNTER — Ambulatory Visit: Payer: Self-pay | Admitting: Orthopaedic Surgery

## 2018-09-18 ENCOUNTER — Telehealth: Payer: Self-pay | Admitting: Orthopaedic Surgery

## 2018-09-18 NOTE — Telephone Encounter (Signed)
Patient requests refill on Hydrocortisone/Acetaminophen 7.5-325  Mgs. Qty 100  Sig: Take 1 tablet by mouth every 6 (six) hours as needed for moderate pain (Must last 30 days.).        [facility] [place]

## 2018-09-19 MED ORDER — HYDROCODONE-ACETAMINOPHEN 7.5-325 MG PO TABS: 1.0000 | ORAL_TABLET | Freq: Four times a day (QID) | ORAL | 0 refills | Status: DC | PRN

## 2018-10-18 ENCOUNTER — Telehealth: Payer: Self-pay | Admitting: Orthopaedic Surgery

## 2018-10-18 MED ORDER — HYDROCODONE-ACETAMINOPHEN 7.5-325 MG PO TABS: 1.0000 | ORAL_TABLET | Freq: Four times a day (QID) | ORAL | 0 refills | Status: DC | PRN

## 2018-10-18 NOTE — Telephone Encounter (Signed)
Dr Sanjuan Dame patient called to request refill:

## 2018-10-18 NOTE — Telephone Encounter (Signed)
Continued: refill request for medication: HYDROcodone-acetaminophen (NORCO) 7.5-325 MG tablet 100 tablet  -[facility]

## 2018-10-19 DIAGNOSIS — I739 Peripheral vascular disease, unspecified: Secondary | ICD-10-CM | POA: Diagnosis not present

## 2018-10-19 DIAGNOSIS — Z5181 Encounter for therapeutic drug level monitoring: Secondary | ICD-10-CM | POA: Diagnosis not present

## 2018-10-19 DIAGNOSIS — E1142 Type 2 diabetes mellitus with diabetic polyneuropathy: Secondary | ICD-10-CM | POA: Diagnosis not present

## 2018-10-19 DIAGNOSIS — G8929 Other chronic pain: Secondary | ICD-10-CM | POA: Diagnosis not present

## 2018-11-16 ENCOUNTER — Telehealth: Payer: Self-pay | Admitting: Orthopaedic Surgery

## 2018-11-16 MED ORDER — HYDROCODONE-ACETAMINOPHEN 7.5-325 MG PO TABS: 1.0000 | ORAL_TABLET | Freq: Four times a day (QID) | ORAL | 0 refills | Status: DC | PRN

## 2018-11-16 NOTE — Telephone Encounter (Signed)
Patient called and stated that her medication will run out over the weekend and requests that this be sent in for her today.  Hydrocodone/Acetaminophen 7.5-325  Mgs.   Qty  100  Sig: Take 1 tablet by mouth every 6 (six) hours as needed for moderate pain (Must last 30 days.).  Patient states she uses [facility] in [place]

## 2018-11-21 ENCOUNTER — Ambulatory Visit: Payer: Self-pay | Admitting: Orthopaedic Surgery

## 2018-11-22 DIAGNOSIS — E1140 Type 2 diabetes mellitus with diabetic neuropathy, unspecified: Secondary | ICD-10-CM | POA: Diagnosis not present

## 2018-11-22 DIAGNOSIS — E119 Type 2 diabetes mellitus without complications: Secondary | ICD-10-CM | POA: Diagnosis not present

## 2018-11-22 DIAGNOSIS — L84 Corns and callosities: Secondary | ICD-10-CM | POA: Diagnosis not present

## 2018-12-13 DIAGNOSIS — J00 Acute nasopharyngitis [common cold]: Secondary | ICD-10-CM | POA: Diagnosis not present

## 2019-02-03 ENCOUNTER — Emergency Department (HOSPITAL_COMMUNITY): Payer: PPO

## 2019-02-03 ENCOUNTER — Encounter (HOSPITAL_COMMUNITY): Payer: Self-pay | Admitting: Emergency Medicine

## 2019-02-03 ENCOUNTER — Emergency Department (HOSPITAL_COMMUNITY)
Admission: EM | Admit: 2019-02-03 | Discharge: 2019-02-03 | Disposition: A | Discharge status: Home / Self Care | Payer: PPO | Attending: Emergency Medicine | Admitting: Emergency Medicine

## 2019-02-03 ENCOUNTER — Other Ambulatory Visit: Payer: Self-pay

## 2019-02-03 DIAGNOSIS — M79605 Pain in left leg: Secondary | ICD-10-CM | POA: Diagnosis not present

## 2019-02-03 DIAGNOSIS — I10 Essential (primary) hypertension: Secondary | ICD-10-CM | POA: Insufficient documentation

## 2019-02-03 DIAGNOSIS — E119 Type 2 diabetes mellitus without complications: Secondary | ICD-10-CM | POA: Diagnosis not present

## 2019-02-03 DIAGNOSIS — M25552 Pain in left hip: Secondary | ICD-10-CM | POA: Diagnosis not present

## 2019-02-03 DIAGNOSIS — Y999 Unspecified external cause status: Secondary | ICD-10-CM | POA: Diagnosis not present

## 2019-02-03 DIAGNOSIS — E1140 Type 2 diabetes mellitus with diabetic neuropathy, unspecified: Secondary | ICD-10-CM | POA: Diagnosis not present

## 2019-02-03 DIAGNOSIS — Y939 Activity, unspecified: Secondary | ICD-10-CM | POA: Diagnosis not present

## 2019-02-03 DIAGNOSIS — Y929 Unspecified place or not applicable: Secondary | ICD-10-CM | POA: Diagnosis not present

## 2019-02-03 DIAGNOSIS — S8992XA Unspecified injury of left lower leg, initial encounter: Secondary | ICD-10-CM | POA: Diagnosis not present

## 2019-02-03 DIAGNOSIS — W010XXA Fall on same level from slipping, tripping and stumbling without subsequent striking against object, initial encounter: Secondary | ICD-10-CM | POA: Insufficient documentation

## 2019-02-03 DIAGNOSIS — S76012A Strain of muscle, fascia and tendon of left hip, initial encounter: Secondary | ICD-10-CM | POA: Insufficient documentation

## 2019-02-03 DIAGNOSIS — S7002XA Contusion of left hip, initial encounter: Secondary | ICD-10-CM | POA: Diagnosis not present

## 2019-02-03 MED ORDER — HYDROCODONE-ACETAMINOPHEN 5-325 MG PO TABS: ORAL_TABLET | ORAL | 0 refills | Status: AC

## 2019-02-03 MED ORDER — HYDROCODONE-ACETAMINOPHEN 5-325 MG PO TABS
1.0000 | ORAL_TABLET | Freq: Once | ORAL | Status: AC
  Administered 2019-02-03: 1 via ORAL
  Filled 2019-02-03: qty 1

## 2019-02-03 NOTE — Discharge Instructions (Addendum)
Wear the boot as needed for support and weight bearing.  Also use your crutches or cane to support most of your weight when you walk.  Elevate your leg and apply ice packs on/off.  Follow-up with your primary doctor or with the orthopedic provider listed.

## 2019-02-03 NOTE — ED Provider Notes (Signed)
[facility] EMERGENCY DEPARTMENT Provider Note   CSN: [id number] Arrival date & time: 02/03/19  4098    History   Chief Complaint Chief Complaint  Patient presents with  . Fall    HPI Valerie Huff is a 73 y.o. female.     HPI   Valerie Huff is a 73 y.o. female who presents to the Emergency Department complaining of pain to her lateral left calf and ankle secondary to a mechical fall that occurred last evening.  States she slipped and fell on a wet floor, landing with her left leg "twisted to my side"  She describes a throbbing pain to her lateral calf that radiates to her ankle and worsens with certain movements of her foot and with weight bearing.  She applied ice and topical muscle rubs without relief.  She also complains of milder pain to her left groin.  She has been using a walker and tried crutches, but she is unable to bear weight due to level of pain.  She denies other injuries, swelling and numbness.     Past Medical History:  Diagnosis Date  . Arthritis   . Diabetes mellitus without complication (HCC)   . Hypertension   . Neuropathy     There are no active problems to display for this patient.   History reviewed. No pertinent surgical history.   OB History   No obstetric history on file.      Home Medications    Prior to Admission medications   Medication Sig Start Date End Date Taking? Authorizing Provider  amLODipine (NORVASC) 5 MG tablet  01/23/18   [provider]  atenolol (TENORMIN) 100 MG tablet Take 100 mg by mouth daily. 02/20/14   [provider]  cyclobenzaprine (FLEXERIL) 10 MG tablet Take 1 tablet (10 mg total) by mouth 3 (three) times daily. Take one tablet every 8 hours as needed for spasm. 12/01/16   Darreld Mclean, MD  diclofenac (VOLTAREN) 75 MG EC tablet Take 1 tablet by mouth 2 (two) times daily. 02/20/14   [provider]  glimepiride (AMARYL) 4 MG tablet Take 4 mg by mouth 2 (two) times daily. 02/20/14    [provider]  HYDROcodone-acetaminophen (NORCO) 7.5-325 MG tablet Take 1 tablet by mouth every 6 (six) hours as needed for moderate pain (Must last 30 days.). 11/16/18   Darreld Mclean, MD  losartan (COZAAR) 100 MG tablet Take 100 mg by mouth daily. 02/20/14   [provider]  metFORMIN (GLUCOPHAGE) 500 MG tablet Take 1,000 mg by mouth 2 (two) times daily. 02/20/14   [provider]  tiZANidine (ZANAFLEX) 4 MG capsule Take 4 mg by mouth.    [provider]    Family History Family History  Problem Relation Age of Onset  . Pneumonia Mother   . Alzheimer's disease Father     Social History Social History   Tobacco Use  . Smoking status: Never Smoker  . Smokeless tobacco: Never Used  Substance Use Topics  . Alcohol use: No  . Drug use: No     Allergies   Patient has no known allergies.   Review of Systems Review of Systems  Constitutional: Negative for chills and fever.  Respiratory: Negative for shortness of breath.   Cardiovascular: Negative for chest pain.  Gastrointestinal: Negative for abdominal pain, nausea and vomiting.  Musculoskeletal: Positive for arthralgias, joint swelling and myalgias (pain to lateral left calf).  Skin: Negative for color change and wound.  Neurological: Negative for  dizziness, syncope, weakness, numbness and headaches.     Physical Exam Updated Vital Signs BP (!) 161/67 (BP Location: Right Arm)   Pulse 92   Temp 98.7 F (37.1 C) (Oral)   Resp 17   Ht 5\' 4"  (1.626 m)   Wt 81.2 kg   SpO2 99%   BMI 30.73 kg/m   Physical Exam Vitals signs and nursing note reviewed.  Constitutional:      Appearance: Normal appearance. She is not ill-appearing.  HENT:     Head: Atraumatic.  Neck:     Musculoskeletal: Normal range of motion. No muscular tenderness.  Cardiovascular:     Rate and Rhythm: Normal rate and regular rhythm.     Pulses: Normal pulses.  Pulmonary:     Effort: Pulmonary effort is normal.      Breath sounds: Normal breath sounds.  Musculoskeletal:        General: Tenderness and signs of injury present. No swelling.     Comments: Diffuse ttp of the lateral aspect of the left lower leg, left medial hip and left lateral ankle.  No edema or bony deformity.  Compartments are soft.  Left knee joint is non-tender  Skin:    General: Skin is warm.     Capillary Refill: Capillary refill takes less than 2 seconds.     Findings: No erythema or rash.  Neurological:     General: No focal deficit present.     Mental Status: She is alert and oriented to person, place, and time.     Sensory: No sensory deficit.     Motor: No weakness.      ED Treatments / Results  Labs (all labs ordered are listed, but only abnormal results are displayed) Labs Reviewed - No data to display  EKG None  Radiology Dg Tibia/fibula Left  Result Date: 02/03/2019 CLINICAL DATA:  Fall with left leg pain.  Initial encounter. EXAM: LEFT TIBIA AND FIBULA - 2 VIEW COMPARISON:  None. FINDINGS: Nonspecific subcutaneous reticulation in the leg that is generalized and may be unrelated. No acute fracture or dislocation. IMPRESSION: Negative for fracture. Electronically Signed   By: Marnee Spring M.D.   On: 02/03/2019 10:42   Dg Hip Unilat W Or Wo Pelvis 2-3 Views Left  Result Date: 02/03/2019 CLINICAL DATA:  Fall with left leg pain. Left groin pain. Initial encounter. EXAM: DG HIP (WITH OR WITHOUT PELVIS) 2-3V LEFT COMPARISON:  None. FINDINGS: There is no evidence of hip fracture or dislocation. There is no evidence of arthropathy or other focal bone abnormality. IMPRESSION: Negative. Electronically Signed   By: Marnee Spring M.D.   On: 02/03/2019 10:43    Procedures Procedures (including critical care time)  Medications Ordered in ED Medications  HYDROcodone-acetaminophen (NORCO/VICODIN) 5-325 MG per tablet 1 tablet (has no administration in time range)     Initial Impression / Assessment and Plan / ED  Course  I have reviewed the triage vital signs and the nursing notes.  Pertinent labs & imaging results that were available during my care of the patient were reviewed by me and considered in my medical decision making (see chart for details).        Pt is pain to left lower leg that is likely musculoskeletal.  XR are neg for bony injury.  NV intact.  Compartments are soft.    Pain improved after pain medication and application of a cam walker.  She has own cane and crutches.  She appears appropriate for d/c  home and agrees to orthopedic f/u if needed.    Final Clinical Impressions(s) / ED Diagnoses   Final diagnoses:  Injury of left lower leg, initial encounter  Strain of left hip, initial encounter    ED Discharge Orders    None       Rosey Bathriplett, Zalma Channing, PA-C 02/03/19 1136    Benjiman CorePickering, Nathan, MD 02/04/19 1514

## 2019-02-03 NOTE — ED Triage Notes (Signed)
Pt states she slipped on a wet floor last night and hurt her left leg from midcalf down to foot.

## 2019-04-10 DIAGNOSIS — H524 Presbyopia: Secondary | ICD-10-CM | POA: Diagnosis not present

## 2019-04-26 DIAGNOSIS — Z13220 Encounter for screening for lipoid disorders: Secondary | ICD-10-CM | POA: Diagnosis not present

## 2019-04-26 DIAGNOSIS — Z0000 Encounter for general adult medical examination without abnormal findings: Secondary | ICD-10-CM | POA: Diagnosis not present

## 2019-04-26 DIAGNOSIS — Z136 Encounter for screening for cardiovascular disorders: Secondary | ICD-10-CM | POA: Diagnosis not present

## 2019-04-26 DIAGNOSIS — I10 Essential (primary) hypertension: Secondary | ICD-10-CM | POA: Diagnosis not present

## 2019-04-26 DIAGNOSIS — Z7289 Other problems related to lifestyle: Secondary | ICD-10-CM | POA: Diagnosis not present

## 2019-04-26 DIAGNOSIS — R5383 Other fatigue: Secondary | ICD-10-CM | POA: Diagnosis not present

## 2019-04-26 DIAGNOSIS — E1142 Type 2 diabetes mellitus with diabetic polyneuropathy: Secondary | ICD-10-CM | POA: Diagnosis not present

## 2019-04-26 DIAGNOSIS — R7302 Impaired glucose tolerance (oral): Secondary | ICD-10-CM | POA: Diagnosis not present

## 2019-05-21 DIAGNOSIS — H35033 Hypertensive retinopathy, bilateral: Secondary | ICD-10-CM | POA: Diagnosis not present

## 2019-05-21 DIAGNOSIS — H2511 Age-related nuclear cataract, right eye: Secondary | ICD-10-CM | POA: Diagnosis not present

## 2019-05-21 DIAGNOSIS — E113393 Type 2 diabetes mellitus with moderate nonproliferative diabetic retinopathy without macular edema, bilateral: Secondary | ICD-10-CM | POA: Diagnosis not present

## 2019-05-21 DIAGNOSIS — H2513 Age-related nuclear cataract, bilateral: Secondary | ICD-10-CM | POA: Diagnosis not present

## 2019-05-21 DIAGNOSIS — H25013 Cortical age-related cataract, bilateral: Secondary | ICD-10-CM | POA: Diagnosis not present

## 2019-05-23 ENCOUNTER — Other Ambulatory Visit: Payer: Self-pay

## 2019-05-23 ENCOUNTER — Encounter (INDEPENDENT_AMBULATORY_CARE_PROVIDER_SITE_OTHER): Payer: PPO | Admitting: Ophthalmology

## 2019-05-23 DIAGNOSIS — E113392 Type 2 diabetes mellitus with moderate nonproliferative diabetic retinopathy without macular edema, left eye: Secondary | ICD-10-CM

## 2019-05-23 DIAGNOSIS — H35033 Hypertensive retinopathy, bilateral: Secondary | ICD-10-CM | POA: Diagnosis not present

## 2019-05-23 DIAGNOSIS — E113311 Type 2 diabetes mellitus with moderate nonproliferative diabetic retinopathy with macular edema, right eye: Secondary | ICD-10-CM

## 2019-05-23 DIAGNOSIS — I10 Essential (primary) hypertension: Secondary | ICD-10-CM | POA: Diagnosis not present

## 2019-05-23 DIAGNOSIS — E11319 Type 2 diabetes mellitus with unspecified diabetic retinopathy without macular edema: Secondary | ICD-10-CM | POA: Diagnosis not present

## 2019-05-23 DIAGNOSIS — H353122 Nonexudative age-related macular degeneration, left eye, intermediate dry stage: Secondary | ICD-10-CM

## 2019-06-13 DIAGNOSIS — H16223 Keratoconjunctivitis sicca, not specified as Sjogren's, bilateral: Secondary | ICD-10-CM | POA: Diagnosis not present

## 2019-06-13 DIAGNOSIS — H04123 Dry eye syndrome of bilateral lacrimal glands: Secondary | ICD-10-CM | POA: Diagnosis not present

## 2019-06-20 ENCOUNTER — Encounter (INDEPENDENT_AMBULATORY_CARE_PROVIDER_SITE_OTHER): Payer: PPO | Admitting: Ophthalmology

## 2019-06-20 ENCOUNTER — Other Ambulatory Visit: Payer: Self-pay

## 2019-06-20 DIAGNOSIS — I10 Essential (primary) hypertension: Secondary | ICD-10-CM

## 2019-06-20 DIAGNOSIS — E11311 Type 2 diabetes mellitus with unspecified diabetic retinopathy with macular edema: Secondary | ICD-10-CM

## 2019-06-20 DIAGNOSIS — H43813 Vitreous degeneration, bilateral: Secondary | ICD-10-CM | POA: Diagnosis not present

## 2019-06-20 DIAGNOSIS — H35033 Hypertensive retinopathy, bilateral: Secondary | ICD-10-CM

## 2019-06-20 DIAGNOSIS — H2511 Age-related nuclear cataract, right eye: Secondary | ICD-10-CM

## 2019-06-20 DIAGNOSIS — E113213 Type 2 diabetes mellitus with mild nonproliferative diabetic retinopathy with macular edema, bilateral: Secondary | ICD-10-CM

## 2019-06-27 DIAGNOSIS — S82402D Unspecified fracture of shaft of left fibula, subsequent encounter for closed fracture with routine healing: Secondary | ICD-10-CM | POA: Diagnosis not present

## 2019-06-27 DIAGNOSIS — M25562 Pain in left knee: Secondary | ICD-10-CM | POA: Diagnosis not present

## 2019-06-27 DIAGNOSIS — N760 Acute vaginitis: Secondary | ICD-10-CM | POA: Diagnosis not present

## 2019-06-27 DIAGNOSIS — S82832D Other fracture of upper and lower end of left fibula, subsequent encounter for closed fracture with routine healing: Secondary | ICD-10-CM | POA: Diagnosis not present

## 2019-06-27 DIAGNOSIS — E1142 Type 2 diabetes mellitus with diabetic polyneuropathy: Secondary | ICD-10-CM | POA: Diagnosis not present

## 2019-06-27 DIAGNOSIS — G8929 Other chronic pain: Secondary | ICD-10-CM | POA: Diagnosis not present

## 2019-06-27 DIAGNOSIS — L03012 Cellulitis of left finger: Secondary | ICD-10-CM | POA: Diagnosis not present

## 2019-07-19 ENCOUNTER — Encounter (INDEPENDENT_AMBULATORY_CARE_PROVIDER_SITE_OTHER): Payer: PPO | Admitting: Ophthalmology

## 2019-07-19 DIAGNOSIS — H43813 Vitreous degeneration, bilateral: Secondary | ICD-10-CM

## 2019-07-19 DIAGNOSIS — H35033 Hypertensive retinopathy, bilateral: Secondary | ICD-10-CM

## 2019-07-19 DIAGNOSIS — E113211 Type 2 diabetes mellitus with mild nonproliferative diabetic retinopathy with macular edema, right eye: Secondary | ICD-10-CM

## 2019-07-19 DIAGNOSIS — E11311 Type 2 diabetes mellitus with unspecified diabetic retinopathy with macular edema: Secondary | ICD-10-CM

## 2019-07-19 DIAGNOSIS — E113392 Type 2 diabetes mellitus with moderate nonproliferative diabetic retinopathy without macular edema, left eye: Secondary | ICD-10-CM | POA: Diagnosis not present

## 2019-07-19 DIAGNOSIS — I10 Essential (primary) hypertension: Secondary | ICD-10-CM

## 2019-07-19 DIAGNOSIS — H35073 Retinal telangiectasis, bilateral: Secondary | ICD-10-CM | POA: Diagnosis not present

## 2019-07-19 DIAGNOSIS — H2513 Age-related nuclear cataract, bilateral: Secondary | ICD-10-CM

## 2019-07-27 DIAGNOSIS — G8929 Other chronic pain: Secondary | ICD-10-CM | POA: Diagnosis not present

## 2019-07-30 DIAGNOSIS — Z23 Encounter for immunization: Secondary | ICD-10-CM | POA: Diagnosis not present

## 2019-07-30 DIAGNOSIS — E1142 Type 2 diabetes mellitus with diabetic polyneuropathy: Secondary | ICD-10-CM | POA: Diagnosis not present

## 2019-07-30 DIAGNOSIS — M25562 Pain in left knee: Secondary | ICD-10-CM | POA: Diagnosis not present

## 2019-07-30 DIAGNOSIS — I10 Essential (primary) hypertension: Secondary | ICD-10-CM | POA: Diagnosis not present

## 2019-07-30 DIAGNOSIS — G8929 Other chronic pain: Secondary | ICD-10-CM | POA: Diagnosis not present

## 2019-08-07 DIAGNOSIS — H2512 Age-related nuclear cataract, left eye: Secondary | ICD-10-CM | POA: Diagnosis not present

## 2019-08-07 DIAGNOSIS — H25812 Combined forms of age-related cataract, left eye: Secondary | ICD-10-CM | POA: Diagnosis not present

## 2019-08-23 ENCOUNTER — Encounter (INDEPENDENT_AMBULATORY_CARE_PROVIDER_SITE_OTHER): Payer: PPO | Admitting: Ophthalmology

## 2019-08-23 DIAGNOSIS — E113211 Type 2 diabetes mellitus with mild nonproliferative diabetic retinopathy with macular edema, right eye: Secondary | ICD-10-CM | POA: Diagnosis not present

## 2019-08-23 DIAGNOSIS — H43813 Vitreous degeneration, bilateral: Secondary | ICD-10-CM | POA: Diagnosis not present

## 2019-08-23 DIAGNOSIS — E11311 Type 2 diabetes mellitus with unspecified diabetic retinopathy with macular edema: Secondary | ICD-10-CM

## 2019-08-23 DIAGNOSIS — H35033 Hypertensive retinopathy, bilateral: Secondary | ICD-10-CM

## 2019-08-23 DIAGNOSIS — I10 Essential (primary) hypertension: Secondary | ICD-10-CM | POA: Diagnosis not present

## 2019-08-23 DIAGNOSIS — E113312 Type 2 diabetes mellitus with moderate nonproliferative diabetic retinopathy with macular edema, left eye: Secondary | ICD-10-CM

## 2019-09-04 DIAGNOSIS — E119 Type 2 diabetes mellitus without complications: Secondary | ICD-10-CM | POA: Diagnosis not present

## 2019-09-05 ENCOUNTER — Other Ambulatory Visit: Payer: Self-pay | Admitting: Family Medicine

## 2019-09-05 DIAGNOSIS — Z1231 Encounter for screening mammogram for malignant neoplasm of breast: Secondary | ICD-10-CM

## 2019-09-25 ENCOUNTER — Encounter (INDEPENDENT_AMBULATORY_CARE_PROVIDER_SITE_OTHER): Payer: PPO | Admitting: Ophthalmology

## 2019-09-25 DIAGNOSIS — H43813 Vitreous degeneration, bilateral: Secondary | ICD-10-CM

## 2019-09-25 DIAGNOSIS — I10 Essential (primary) hypertension: Secondary | ICD-10-CM | POA: Diagnosis not present

## 2019-09-25 DIAGNOSIS — E113211 Type 2 diabetes mellitus with mild nonproliferative diabetic retinopathy with macular edema, right eye: Secondary | ICD-10-CM

## 2019-09-25 DIAGNOSIS — H35072 Retinal telangiectasis, left eye: Secondary | ICD-10-CM

## 2019-09-25 DIAGNOSIS — E11311 Type 2 diabetes mellitus with unspecified diabetic retinopathy with macular edema: Secondary | ICD-10-CM | POA: Diagnosis not present

## 2019-09-25 DIAGNOSIS — E113292 Type 2 diabetes mellitus with mild nonproliferative diabetic retinopathy without macular edema, left eye: Secondary | ICD-10-CM

## 2019-09-25 DIAGNOSIS — H35033 Hypertensive retinopathy, bilateral: Secondary | ICD-10-CM | POA: Diagnosis not present

## 2019-10-31 ENCOUNTER — Encounter (INDEPENDENT_AMBULATORY_CARE_PROVIDER_SITE_OTHER): Payer: PPO | Admitting: Ophthalmology

## 2019-10-31 DIAGNOSIS — H35033 Hypertensive retinopathy, bilateral: Secondary | ICD-10-CM

## 2019-10-31 DIAGNOSIS — E113292 Type 2 diabetes mellitus with mild nonproliferative diabetic retinopathy without macular edema, left eye: Secondary | ICD-10-CM

## 2019-10-31 DIAGNOSIS — H43813 Vitreous degeneration, bilateral: Secondary | ICD-10-CM

## 2019-10-31 DIAGNOSIS — E113211 Type 2 diabetes mellitus with mild nonproliferative diabetic retinopathy with macular edema, right eye: Secondary | ICD-10-CM

## 2019-10-31 DIAGNOSIS — I10 Essential (primary) hypertension: Secondary | ICD-10-CM

## 2019-10-31 DIAGNOSIS — E11311 Type 2 diabetes mellitus with unspecified diabetic retinopathy with macular edema: Secondary | ICD-10-CM

## 2019-12-03 DIAGNOSIS — I739 Peripheral vascular disease, unspecified: Secondary | ICD-10-CM | POA: Diagnosis not present

## 2019-12-03 DIAGNOSIS — E1142 Type 2 diabetes mellitus with diabetic polyneuropathy: Secondary | ICD-10-CM | POA: Diagnosis not present

## 2019-12-03 DIAGNOSIS — R52 Pain, unspecified: Secondary | ICD-10-CM | POA: Diagnosis not present

## 2019-12-03 DIAGNOSIS — G8929 Other chronic pain: Secondary | ICD-10-CM | POA: Diagnosis not present

## 2019-12-03 DIAGNOSIS — Z5181 Encounter for therapeutic drug level monitoring: Secondary | ICD-10-CM | POA: Diagnosis not present

## 2019-12-03 DIAGNOSIS — N760 Acute vaginitis: Secondary | ICD-10-CM | POA: Diagnosis not present

## 2019-12-12 ENCOUNTER — Encounter (INDEPENDENT_AMBULATORY_CARE_PROVIDER_SITE_OTHER): Payer: PPO | Admitting: Ophthalmology

## 2019-12-18 DIAGNOSIS — L84 Corns and callosities: Secondary | ICD-10-CM | POA: Diagnosis not present

## 2019-12-18 DIAGNOSIS — E1142 Type 2 diabetes mellitus with diabetic polyneuropathy: Secondary | ICD-10-CM | POA: Diagnosis not present

## 2019-12-25 ENCOUNTER — Encounter (INDEPENDENT_AMBULATORY_CARE_PROVIDER_SITE_OTHER): Payer: PPO | Admitting: Ophthalmology

## 2019-12-25 DIAGNOSIS — E11319 Type 2 diabetes mellitus with unspecified diabetic retinopathy without macular edema: Secondary | ICD-10-CM

## 2019-12-25 DIAGNOSIS — H43813 Vitreous degeneration, bilateral: Secondary | ICD-10-CM

## 2019-12-25 DIAGNOSIS — I10 Essential (primary) hypertension: Secondary | ICD-10-CM | POA: Diagnosis not present

## 2019-12-25 DIAGNOSIS — E113293 Type 2 diabetes mellitus with mild nonproliferative diabetic retinopathy without macular edema, bilateral: Secondary | ICD-10-CM | POA: Diagnosis not present

## 2019-12-25 DIAGNOSIS — H35033 Hypertensive retinopathy, bilateral: Secondary | ICD-10-CM

## 2020-01-07 DIAGNOSIS — N3090 Cystitis, unspecified without hematuria: Secondary | ICD-10-CM | POA: Diagnosis not present

## 2020-01-07 DIAGNOSIS — N760 Acute vaginitis: Secondary | ICD-10-CM | POA: Diagnosis not present

## 2020-01-07 DIAGNOSIS — G8929 Other chronic pain: Secondary | ICD-10-CM | POA: Diagnosis not present

## 2020-02-20 ENCOUNTER — Other Ambulatory Visit: Payer: Self-pay

## 2020-02-20 ENCOUNTER — Encounter (INDEPENDENT_AMBULATORY_CARE_PROVIDER_SITE_OTHER): Payer: PPO | Admitting: Ophthalmology

## 2020-02-20 DIAGNOSIS — E113293 Type 2 diabetes mellitus with mild nonproliferative diabetic retinopathy without macular edema, bilateral: Secondary | ICD-10-CM

## 2020-02-20 DIAGNOSIS — H43813 Vitreous degeneration, bilateral: Secondary | ICD-10-CM | POA: Diagnosis not present

## 2020-02-20 DIAGNOSIS — I10 Essential (primary) hypertension: Secondary | ICD-10-CM | POA: Diagnosis not present

## 2020-02-20 DIAGNOSIS — H35033 Hypertensive retinopathy, bilateral: Secondary | ICD-10-CM

## 2020-02-20 DIAGNOSIS — E11319 Type 2 diabetes mellitus with unspecified diabetic retinopathy without macular edema: Secondary | ICD-10-CM | POA: Diagnosis not present

## 2020-04-16 ENCOUNTER — Other Ambulatory Visit: Payer: Self-pay

## 2020-04-16 ENCOUNTER — Encounter (INDEPENDENT_AMBULATORY_CARE_PROVIDER_SITE_OTHER): Payer: PPO | Admitting: Ophthalmology

## 2020-04-16 DIAGNOSIS — I10 Essential (primary) hypertension: Secondary | ICD-10-CM | POA: Diagnosis not present

## 2020-04-16 DIAGNOSIS — H2513 Age-related nuclear cataract, bilateral: Secondary | ICD-10-CM

## 2020-04-16 DIAGNOSIS — E11311 Type 2 diabetes mellitus with unspecified diabetic retinopathy with macular edema: Secondary | ICD-10-CM

## 2020-04-16 DIAGNOSIS — H43813 Vitreous degeneration, bilateral: Secondary | ICD-10-CM | POA: Diagnosis not present

## 2020-04-16 DIAGNOSIS — H35373 Puckering of macula, bilateral: Secondary | ICD-10-CM | POA: Diagnosis not present

## 2020-04-16 DIAGNOSIS — E113212 Type 2 diabetes mellitus with mild nonproliferative diabetic retinopathy with macular edema, left eye: Secondary | ICD-10-CM

## 2020-04-16 DIAGNOSIS — H35033 Hypertensive retinopathy, bilateral: Secondary | ICD-10-CM | POA: Diagnosis not present

## 2020-04-16 DIAGNOSIS — E113311 Type 2 diabetes mellitus with moderate nonproliferative diabetic retinopathy with macular edema, right eye: Secondary | ICD-10-CM | POA: Diagnosis not present

## 2020-04-23 DIAGNOSIS — H353122 Nonexudative age-related macular degeneration, left eye, intermediate dry stage: Secondary | ICD-10-CM | POA: Diagnosis not present

## 2020-04-23 DIAGNOSIS — H35033 Hypertensive retinopathy, bilateral: Secondary | ICD-10-CM | POA: Diagnosis not present

## 2020-04-23 DIAGNOSIS — E113393 Type 2 diabetes mellitus with moderate nonproliferative diabetic retinopathy without macular edema, bilateral: Secondary | ICD-10-CM | POA: Diagnosis not present

## 2020-04-23 DIAGNOSIS — H2511 Age-related nuclear cataract, right eye: Secondary | ICD-10-CM | POA: Diagnosis not present

## 2020-04-23 DIAGNOSIS — H25011 Cortical age-related cataract, right eye: Secondary | ICD-10-CM | POA: Diagnosis not present

## 2020-05-01 DIAGNOSIS — E1142 Type 2 diabetes mellitus with diabetic polyneuropathy: Secondary | ICD-10-CM | POA: Diagnosis not present

## 2020-05-01 DIAGNOSIS — N958 Other specified menopausal and perimenopausal disorders: Secondary | ICD-10-CM | POA: Diagnosis not present

## 2020-05-01 DIAGNOSIS — Z0000 Encounter for general adult medical examination without abnormal findings: Secondary | ICD-10-CM | POA: Diagnosis not present

## 2020-05-01 DIAGNOSIS — R5383 Other fatigue: Secondary | ICD-10-CM | POA: Diagnosis not present

## 2020-05-01 DIAGNOSIS — Z13220 Encounter for screening for lipoid disorders: Secondary | ICD-10-CM | POA: Diagnosis not present

## 2020-05-01 DIAGNOSIS — G4762 Sleep related leg cramps: Secondary | ICD-10-CM | POA: Diagnosis not present

## 2020-05-01 DIAGNOSIS — I10 Essential (primary) hypertension: Secondary | ICD-10-CM | POA: Diagnosis not present

## 2020-05-01 DIAGNOSIS — R82998 Other abnormal findings in urine: Secondary | ICD-10-CM | POA: Diagnosis not present

## 2020-05-01 DIAGNOSIS — Z136 Encounter for screening for cardiovascular disorders: Secondary | ICD-10-CM | POA: Diagnosis not present

## 2020-05-06 DIAGNOSIS — H2511 Age-related nuclear cataract, right eye: Secondary | ICD-10-CM | POA: Diagnosis not present

## 2020-05-06 DIAGNOSIS — H25011 Cortical age-related cataract, right eye: Secondary | ICD-10-CM | POA: Diagnosis not present

## 2020-05-06 DIAGNOSIS — H25811 Combined forms of age-related cataract, right eye: Secondary | ICD-10-CM | POA: Diagnosis not present

## 2020-05-17 IMAGING — DX LEFT TIBIA AND FIBULA - 2 VIEW
2 series · 2 of 2 positions shown · non-contrast
Comparison: None.

CLINICAL DATA: Fall with left leg pain.  Initial encounter.

EXAM:
LEFT TIBIA AND FIBULA - 2 VIEW

[tibia ap]
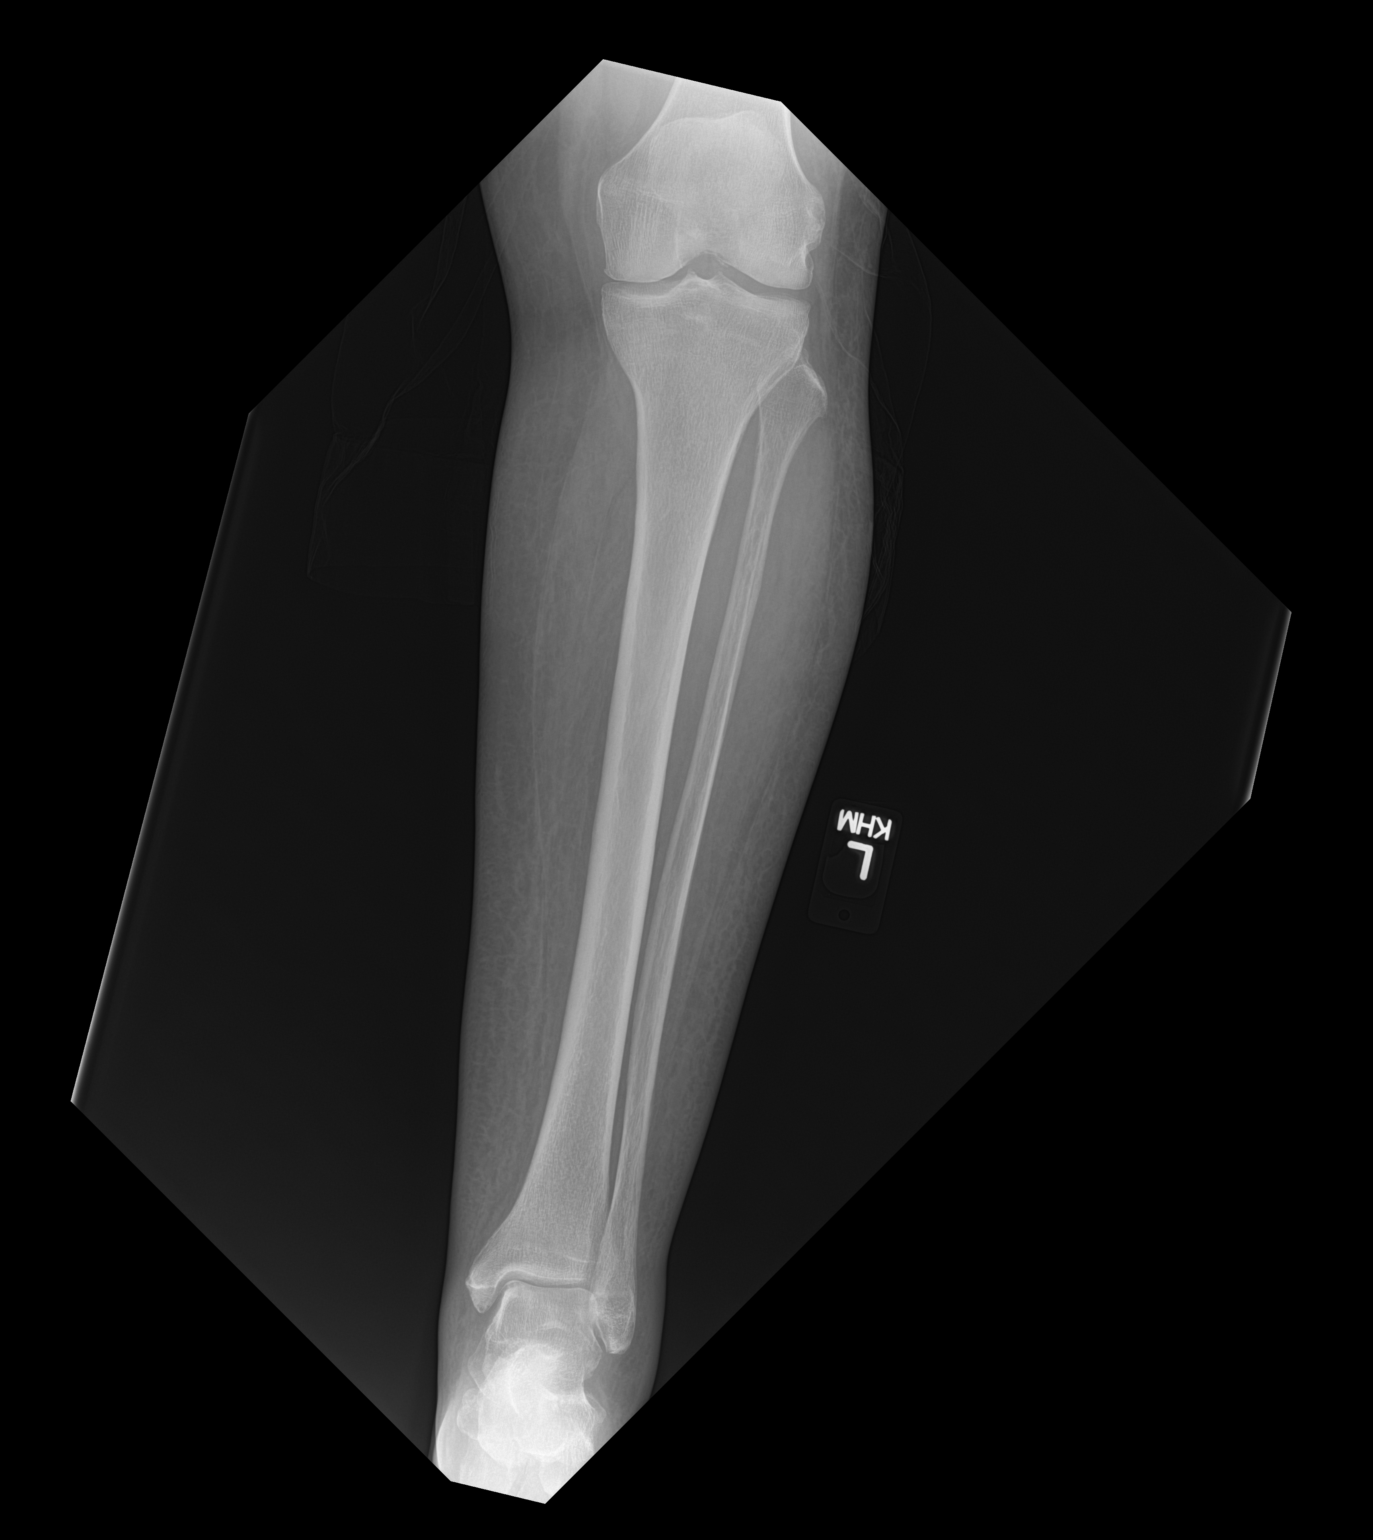

[tibia lat]
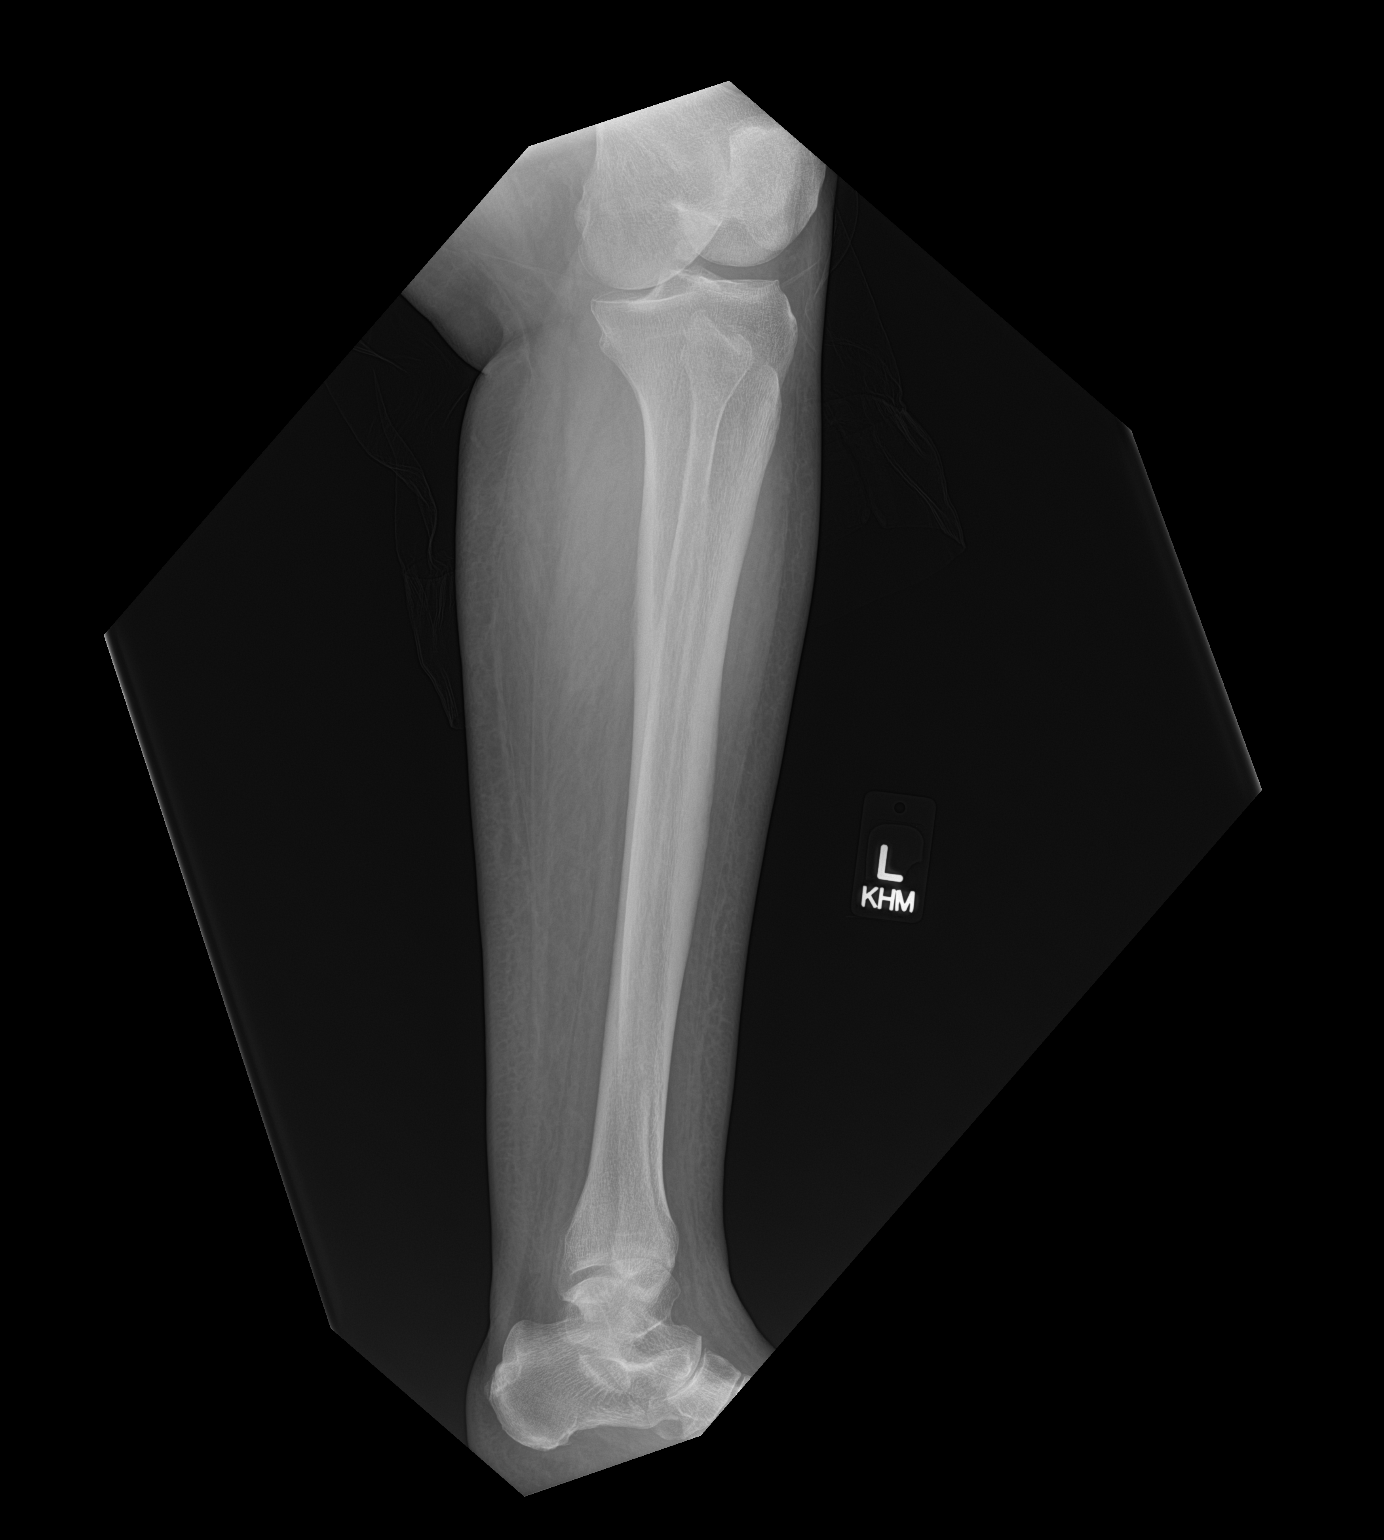

[2 of 2 positions shown; findings below may reference images not displayed]

FINDINGS: Nonspecific subcutaneous reticulation in the leg that is generalized
and may be unrelated. No acute fracture or dislocation.
IMPRESSION: Negative for fracture.

## 2020-05-17 IMAGING — DX DG HIP (WITH OR WITHOUT PELVIS) 2-3V LEFT
3 series · 3 of 3 positions shown · non-contrast
Comparison: None.

CLINICAL DATA: Fall with left leg pain. Left groin pain. Initial
encounter.

EXAM:
DG HIP (WITH OR WITHOUT PELVIS) 2-3V LEFT

[pelvis ap]
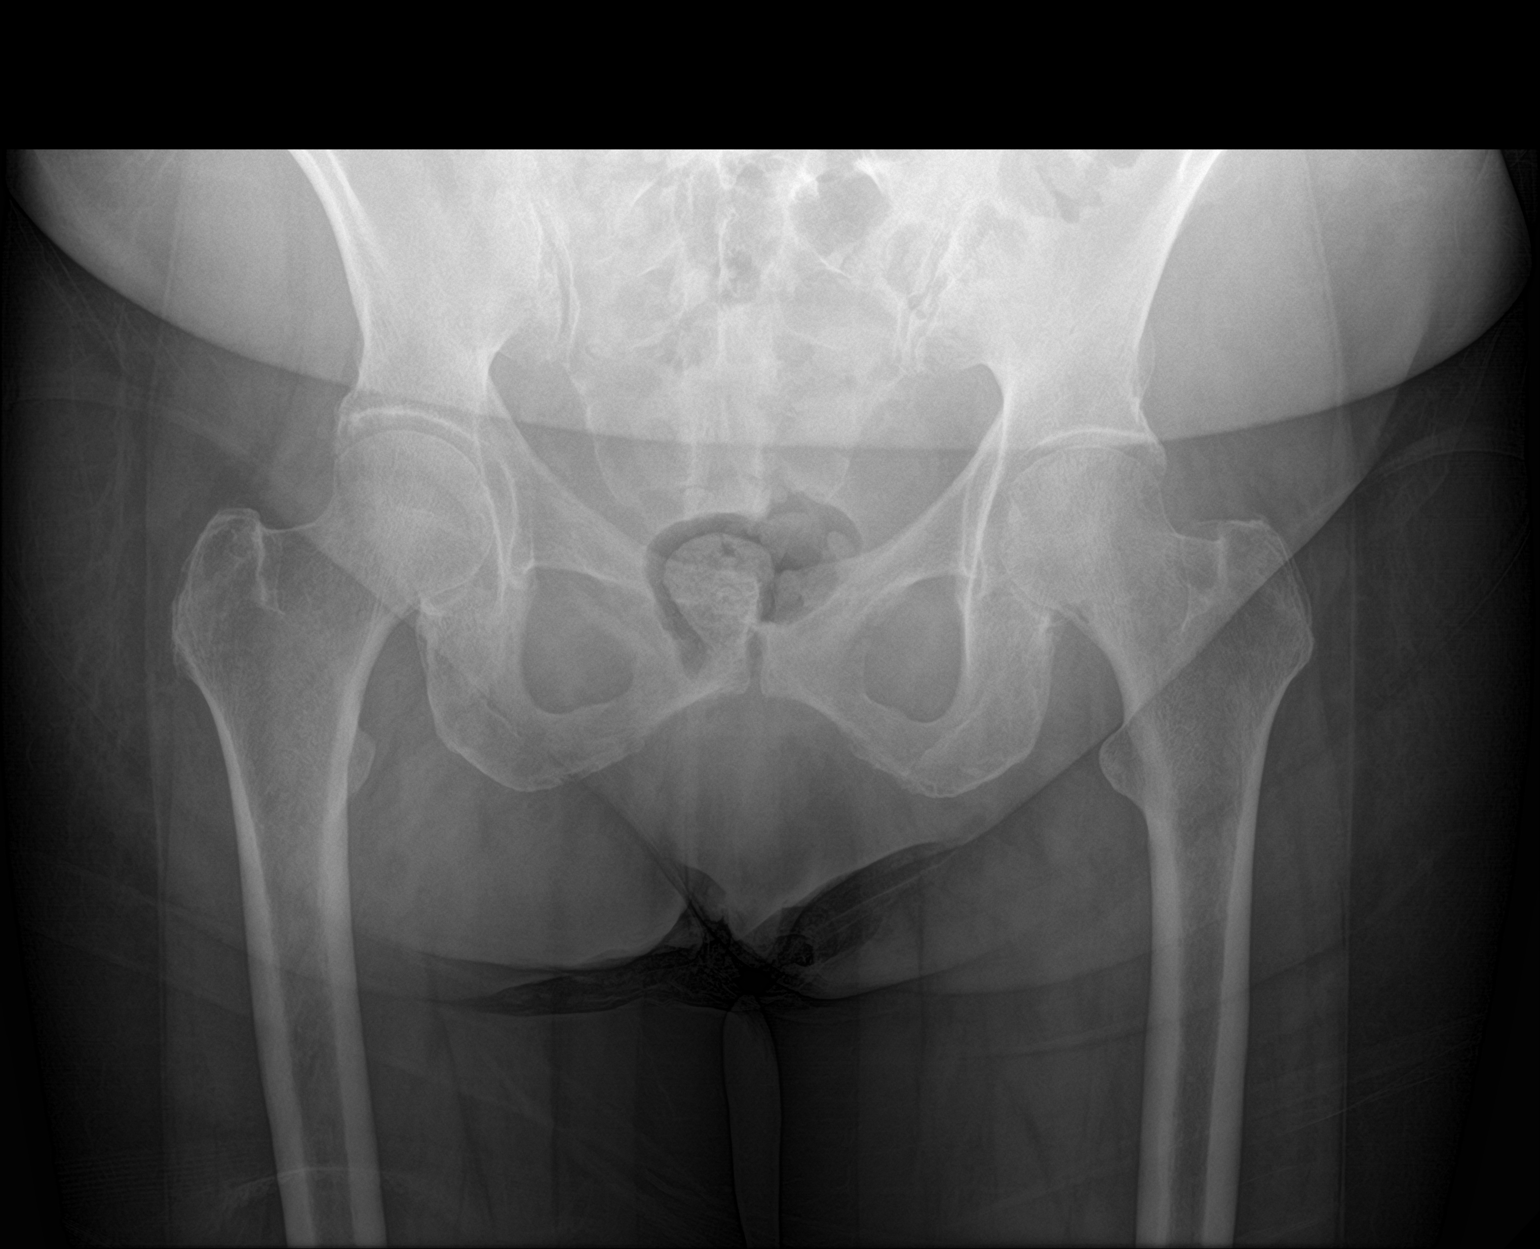

[hip ap]
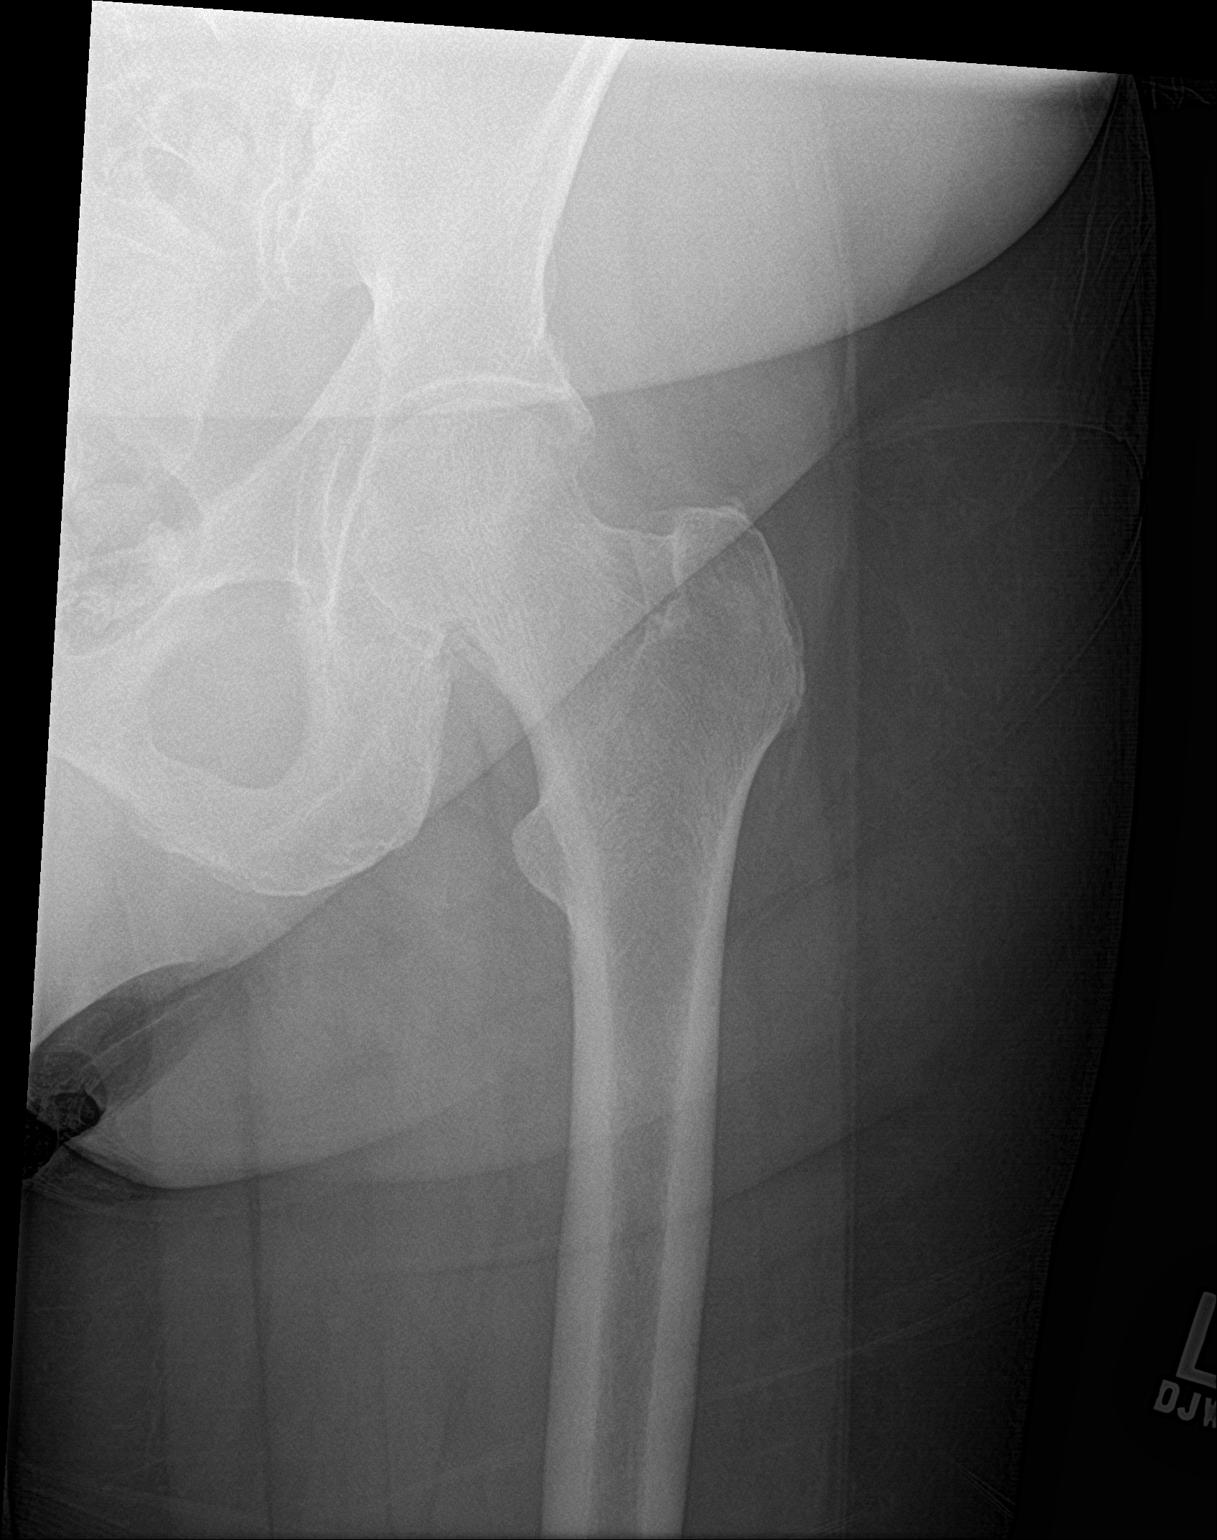

[hip lat]
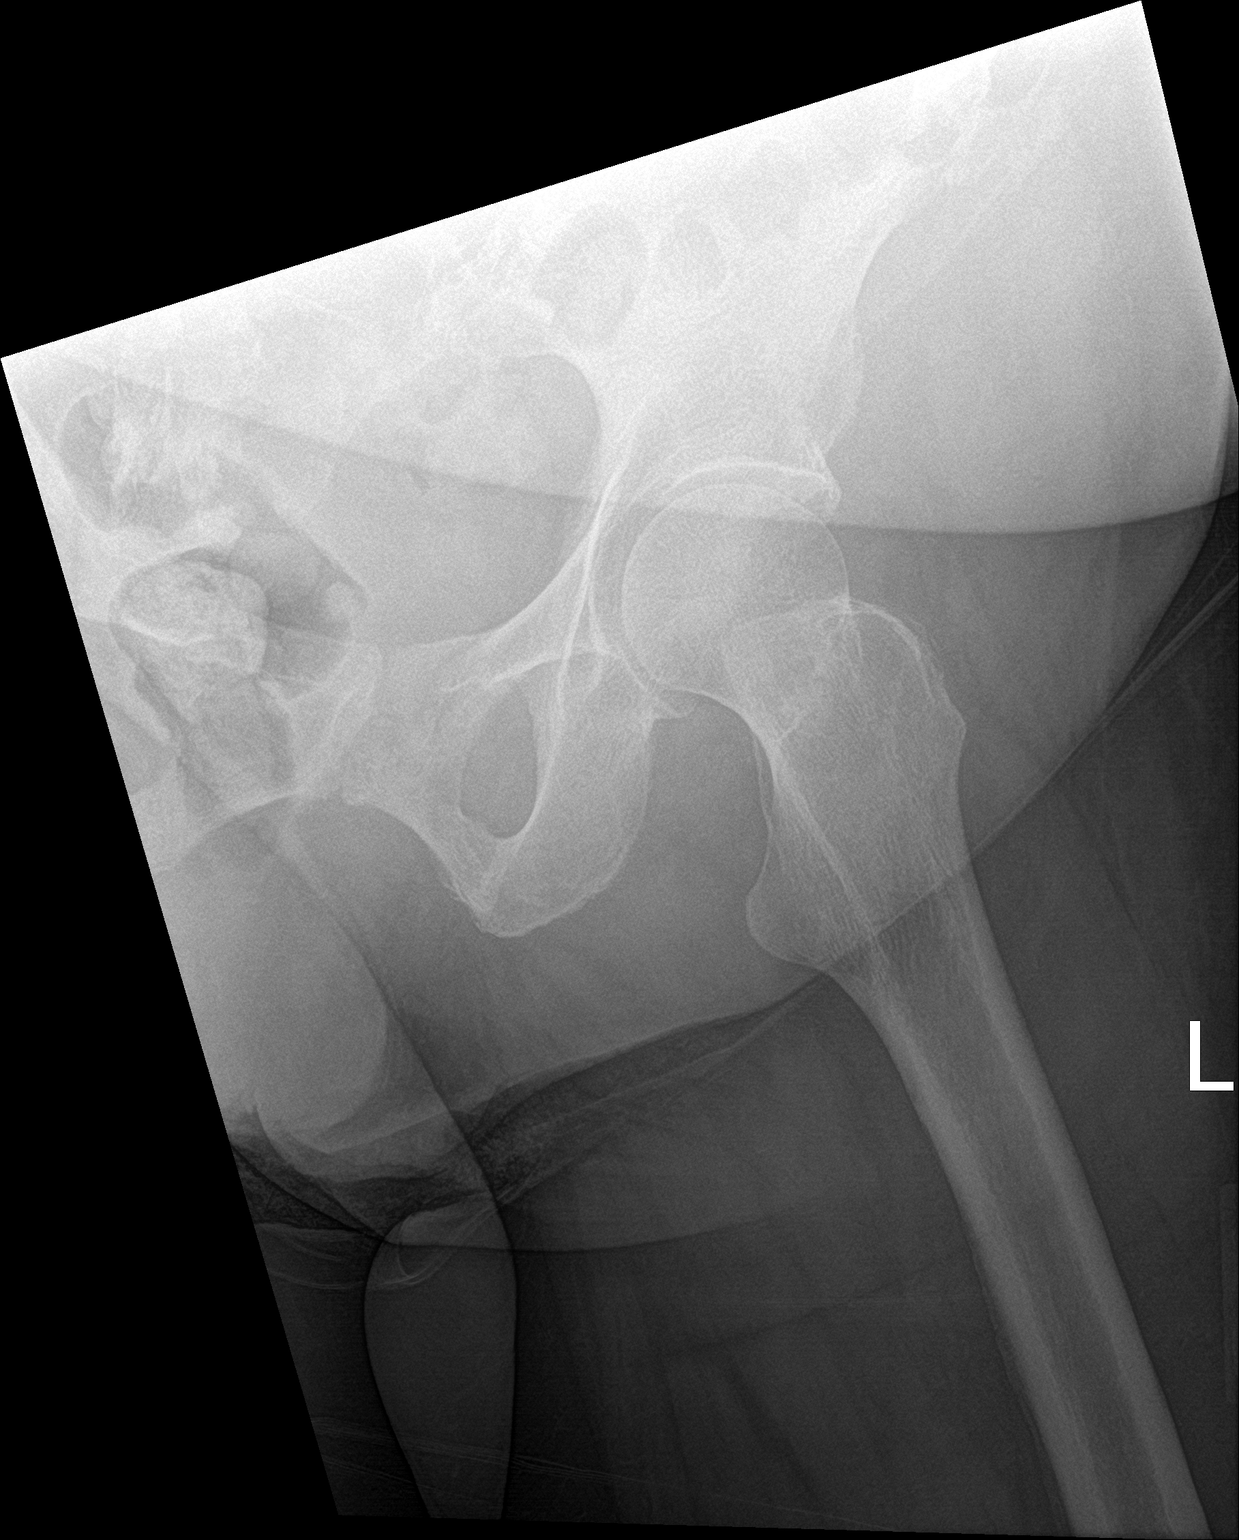

[3 of 3 positions shown; findings below may reference images not displayed]

FINDINGS: There is no evidence of hip fracture or dislocation. There is no
evidence of arthropathy or other focal bone abnormality.
IMPRESSION: Negative.

## 2020-06-18 ENCOUNTER — Other Ambulatory Visit: Payer: Self-pay

## 2020-06-18 ENCOUNTER — Encounter (INDEPENDENT_AMBULATORY_CARE_PROVIDER_SITE_OTHER): Payer: PPO | Admitting: Ophthalmology

## 2020-06-18 DIAGNOSIS — H43813 Vitreous degeneration, bilateral: Secondary | ICD-10-CM | POA: Diagnosis not present

## 2020-06-18 DIAGNOSIS — H35073 Retinal telangiectasis, bilateral: Secondary | ICD-10-CM | POA: Diagnosis not present

## 2020-06-18 DIAGNOSIS — E113292 Type 2 diabetes mellitus with mild nonproliferative diabetic retinopathy without macular edema, left eye: Secondary | ICD-10-CM

## 2020-06-18 DIAGNOSIS — H35033 Hypertensive retinopathy, bilateral: Secondary | ICD-10-CM | POA: Diagnosis not present

## 2020-06-18 DIAGNOSIS — E11311 Type 2 diabetes mellitus with unspecified diabetic retinopathy with macular edema: Secondary | ICD-10-CM

## 2020-06-18 DIAGNOSIS — E113311 Type 2 diabetes mellitus with moderate nonproliferative diabetic retinopathy with macular edema, right eye: Secondary | ICD-10-CM | POA: Diagnosis not present

## 2020-06-18 DIAGNOSIS — I10 Essential (primary) hypertension: Secondary | ICD-10-CM

## 2020-08-21 ENCOUNTER — Encounter (INDEPENDENT_AMBULATORY_CARE_PROVIDER_SITE_OTHER): Payer: PPO | Admitting: Ophthalmology

## 2020-09-04 ENCOUNTER — Encounter (INDEPENDENT_AMBULATORY_CARE_PROVIDER_SITE_OTHER): Payer: PPO | Admitting: Ophthalmology

## 2020-09-04 ENCOUNTER — Other Ambulatory Visit: Payer: Self-pay

## 2020-09-04 DIAGNOSIS — E11311 Type 2 diabetes mellitus with unspecified diabetic retinopathy with macular edema: Secondary | ICD-10-CM | POA: Diagnosis not present

## 2020-09-04 DIAGNOSIS — H35073 Retinal telangiectasis, bilateral: Secondary | ICD-10-CM

## 2020-09-04 DIAGNOSIS — E113292 Type 2 diabetes mellitus with mild nonproliferative diabetic retinopathy without macular edema, left eye: Secondary | ICD-10-CM | POA: Diagnosis not present

## 2020-09-04 DIAGNOSIS — I10 Essential (primary) hypertension: Secondary | ICD-10-CM | POA: Diagnosis not present

## 2020-09-04 DIAGNOSIS — E113211 Type 2 diabetes mellitus with mild nonproliferative diabetic retinopathy with macular edema, right eye: Secondary | ICD-10-CM

## 2020-09-04 DIAGNOSIS — H35033 Hypertensive retinopathy, bilateral: Secondary | ICD-10-CM

## 2020-09-04 DIAGNOSIS — H43813 Vitreous degeneration, bilateral: Secondary | ICD-10-CM

## 2020-11-06 ENCOUNTER — Other Ambulatory Visit: Payer: Self-pay

## 2020-11-06 ENCOUNTER — Encounter (INDEPENDENT_AMBULATORY_CARE_PROVIDER_SITE_OTHER): Payer: PPO | Admitting: Ophthalmology

## 2020-11-06 DIAGNOSIS — I10 Essential (primary) hypertension: Secondary | ICD-10-CM | POA: Diagnosis not present

## 2020-11-06 DIAGNOSIS — E113293 Type 2 diabetes mellitus with mild nonproliferative diabetic retinopathy without macular edema, bilateral: Secondary | ICD-10-CM | POA: Diagnosis not present

## 2020-11-06 DIAGNOSIS — H35033 Hypertensive retinopathy, bilateral: Secondary | ICD-10-CM | POA: Diagnosis not present

## 2020-11-06 DIAGNOSIS — H35073 Retinal telangiectasis, bilateral: Secondary | ICD-10-CM

## 2020-11-06 DIAGNOSIS — H43813 Vitreous degeneration, bilateral: Secondary | ICD-10-CM

## 2021-01-01 ENCOUNTER — Other Ambulatory Visit: Payer: Self-pay

## 2021-01-01 ENCOUNTER — Encounter (INDEPENDENT_AMBULATORY_CARE_PROVIDER_SITE_OTHER): Payer: PPO | Admitting: Ophthalmology

## 2021-01-01 DIAGNOSIS — H35073 Retinal telangiectasis, bilateral: Secondary | ICD-10-CM

## 2021-01-01 DIAGNOSIS — H35033 Hypertensive retinopathy, bilateral: Secondary | ICD-10-CM

## 2021-01-01 DIAGNOSIS — H43813 Vitreous degeneration, bilateral: Secondary | ICD-10-CM | POA: Diagnosis not present

## 2021-01-01 DIAGNOSIS — E113291 Type 2 diabetes mellitus with mild nonproliferative diabetic retinopathy without macular edema, right eye: Secondary | ICD-10-CM | POA: Diagnosis not present

## 2021-01-01 DIAGNOSIS — E113392 Type 2 diabetes mellitus with moderate nonproliferative diabetic retinopathy without macular edema, left eye: Secondary | ICD-10-CM | POA: Diagnosis not present

## 2021-01-01 DIAGNOSIS — I10 Essential (primary) hypertension: Secondary | ICD-10-CM

## 2021-02-02 DIAGNOSIS — H5203 Hypermetropia, bilateral: Secondary | ICD-10-CM | POA: Diagnosis not present

## 2021-02-02 DIAGNOSIS — E113393 Type 2 diabetes mellitus with moderate nonproliferative diabetic retinopathy without macular edema, bilateral: Secondary | ICD-10-CM | POA: Diagnosis not present

## 2021-03-12 ENCOUNTER — Other Ambulatory Visit: Payer: Self-pay

## 2021-03-12 ENCOUNTER — Encounter (INDEPENDENT_AMBULATORY_CARE_PROVIDER_SITE_OTHER): Payer: PPO | Admitting: Ophthalmology

## 2021-03-12 DIAGNOSIS — H35073 Retinal telangiectasis, bilateral: Secondary | ICD-10-CM

## 2021-03-12 DIAGNOSIS — H43813 Vitreous degeneration, bilateral: Secondary | ICD-10-CM

## 2021-03-12 DIAGNOSIS — I10 Essential (primary) hypertension: Secondary | ICD-10-CM

## 2021-03-12 DIAGNOSIS — E113293 Type 2 diabetes mellitus with mild nonproliferative diabetic retinopathy without macular edema, bilateral: Secondary | ICD-10-CM | POA: Diagnosis not present

## 2021-03-12 DIAGNOSIS — H35033 Hypertensive retinopathy, bilateral: Secondary | ICD-10-CM

## 2021-06-04 DIAGNOSIS — H40013 Open angle with borderline findings, low risk, bilateral: Secondary | ICD-10-CM | POA: Diagnosis not present

## 2021-06-18 ENCOUNTER — Encounter (INDEPENDENT_AMBULATORY_CARE_PROVIDER_SITE_OTHER): Payer: PPO | Admitting: Ophthalmology

## 2021-06-22 DIAGNOSIS — R5383 Other fatigue: Secondary | ICD-10-CM | POA: Diagnosis not present

## 2021-06-22 DIAGNOSIS — R059 Cough, unspecified: Secondary | ICD-10-CM | POA: Diagnosis not present

## 2021-06-22 DIAGNOSIS — Z20822 Contact with and (suspected) exposure to covid-19: Secondary | ICD-10-CM | POA: Diagnosis not present

## 2021-06-22 DIAGNOSIS — R0600 Dyspnea, unspecified: Secondary | ICD-10-CM | POA: Diagnosis not present

## 2021-07-22 DIAGNOSIS — R300 Dysuria: Secondary | ICD-10-CM | POA: Diagnosis not present

## 2021-07-22 DIAGNOSIS — Z23 Encounter for immunization: Secondary | ICD-10-CM | POA: Diagnosis not present

## 2021-07-22 DIAGNOSIS — N958 Other specified menopausal and perimenopausal disorders: Secondary | ICD-10-CM | POA: Diagnosis not present

## 2021-07-22 DIAGNOSIS — Z0001 Encounter for general adult medical examination with abnormal findings: Secondary | ICD-10-CM | POA: Diagnosis not present

## 2021-07-22 DIAGNOSIS — I739 Peripheral vascular disease, unspecified: Secondary | ICD-10-CM | POA: Diagnosis not present

## 2021-07-22 DIAGNOSIS — Z5181 Encounter for therapeutic drug level monitoring: Secondary | ICD-10-CM | POA: Diagnosis not present

## 2021-07-22 DIAGNOSIS — E1142 Type 2 diabetes mellitus with diabetic polyneuropathy: Secondary | ICD-10-CM | POA: Diagnosis not present

## 2021-07-22 DIAGNOSIS — Z13220 Encounter for screening for lipoid disorders: Secondary | ICD-10-CM | POA: Diagnosis not present

## 2021-07-22 DIAGNOSIS — Z136 Encounter for screening for cardiovascular disorders: Secondary | ICD-10-CM | POA: Diagnosis not present

## 2021-08-07 ENCOUNTER — Other Ambulatory Visit: Payer: Self-pay

## 2021-08-07 ENCOUNTER — Encounter (INDEPENDENT_AMBULATORY_CARE_PROVIDER_SITE_OTHER): Payer: PPO | Admitting: Ophthalmology

## 2021-08-07 ENCOUNTER — Encounter (INDEPENDENT_AMBULATORY_CARE_PROVIDER_SITE_OTHER): Payer: Self-pay

## 2021-08-11 ENCOUNTER — Other Ambulatory Visit: Payer: Self-pay

## 2021-08-11 ENCOUNTER — Encounter (INDEPENDENT_AMBULATORY_CARE_PROVIDER_SITE_OTHER): Payer: PPO | Admitting: Ophthalmology

## 2021-08-11 DIAGNOSIS — H35073 Retinal telangiectasis, bilateral: Secondary | ICD-10-CM

## 2021-08-11 DIAGNOSIS — E113393 Type 2 diabetes mellitus with moderate nonproliferative diabetic retinopathy without macular edema, bilateral: Secondary | ICD-10-CM | POA: Diagnosis not present

## 2021-08-11 DIAGNOSIS — H35033 Hypertensive retinopathy, bilateral: Secondary | ICD-10-CM | POA: Diagnosis not present

## 2021-08-11 DIAGNOSIS — H43813 Vitreous degeneration, bilateral: Secondary | ICD-10-CM

## 2021-08-11 DIAGNOSIS — I10 Essential (primary) hypertension: Secondary | ICD-10-CM | POA: Diagnosis not present

## 2021-11-03 ENCOUNTER — Encounter (INDEPENDENT_AMBULATORY_CARE_PROVIDER_SITE_OTHER): Payer: PPO | Admitting: Ophthalmology

## 2021-11-03 ENCOUNTER — Other Ambulatory Visit: Payer: Self-pay

## 2021-11-03 DIAGNOSIS — E113393 Type 2 diabetes mellitus with moderate nonproliferative diabetic retinopathy without macular edema, bilateral: Secondary | ICD-10-CM

## 2021-11-03 DIAGNOSIS — H35033 Hypertensive retinopathy, bilateral: Secondary | ICD-10-CM | POA: Diagnosis not present

## 2021-11-03 DIAGNOSIS — I10 Essential (primary) hypertension: Secondary | ICD-10-CM

## 2021-11-03 DIAGNOSIS — H43813 Vitreous degeneration, bilateral: Secondary | ICD-10-CM

## 2021-11-03 DIAGNOSIS — H35073 Retinal telangiectasis, bilateral: Secondary | ICD-10-CM

## 2022-01-26 ENCOUNTER — Encounter (INDEPENDENT_AMBULATORY_CARE_PROVIDER_SITE_OTHER): Payer: PPO | Admitting: Ophthalmology

## 2022-01-26 DIAGNOSIS — I10 Essential (primary) hypertension: Secondary | ICD-10-CM

## 2022-01-26 DIAGNOSIS — E113392 Type 2 diabetes mellitus with moderate nonproliferative diabetic retinopathy without macular edema, left eye: Secondary | ICD-10-CM | POA: Diagnosis not present

## 2022-01-26 DIAGNOSIS — E113291 Type 2 diabetes mellitus with mild nonproliferative diabetic retinopathy without macular edema, right eye: Secondary | ICD-10-CM | POA: Diagnosis not present

## 2022-01-26 DIAGNOSIS — H43813 Vitreous degeneration, bilateral: Secondary | ICD-10-CM | POA: Diagnosis not present

## 2022-01-26 DIAGNOSIS — H35033 Hypertensive retinopathy, bilateral: Secondary | ICD-10-CM

## 2022-01-26 DIAGNOSIS — H35073 Retinal telangiectasis, bilateral: Secondary | ICD-10-CM

## 2022-05-18 ENCOUNTER — Encounter (INDEPENDENT_AMBULATORY_CARE_PROVIDER_SITE_OTHER): Payer: PPO | Admitting: Ophthalmology

## 2022-05-18 DIAGNOSIS — H43813 Vitreous degeneration, bilateral: Secondary | ICD-10-CM

## 2022-05-18 DIAGNOSIS — H35073 Retinal telangiectasis, bilateral: Secondary | ICD-10-CM | POA: Diagnosis not present

## 2022-05-18 DIAGNOSIS — H35033 Hypertensive retinopathy, bilateral: Secondary | ICD-10-CM

## 2022-05-18 DIAGNOSIS — I10 Essential (primary) hypertension: Secondary | ICD-10-CM | POA: Diagnosis not present

## 2022-05-18 DIAGNOSIS — E113293 Type 2 diabetes mellitus with mild nonproliferative diabetic retinopathy without macular edema, bilateral: Secondary | ICD-10-CM

## 2022-09-08 ENCOUNTER — Encounter (INDEPENDENT_AMBULATORY_CARE_PROVIDER_SITE_OTHER): Payer: PPO | Admitting: Ophthalmology

## 2022-09-17 ENCOUNTER — Encounter (INDEPENDENT_AMBULATORY_CARE_PROVIDER_SITE_OTHER): Payer: PPO | Admitting: Ophthalmology
